# Patient Record
Sex: Female | Born: 1945 | Race: White | Hispanic: No | Smoking: Never smoker
Health system: Southern US, Community
[De-identification: ages and names within clinical notes are randomized; demographics above are authoritative.]

## PROBLEM LIST (undated history)

## (undated) DIAGNOSIS — L719 Rosacea, unspecified: Secondary | ICD-10-CM

## (undated) DIAGNOSIS — K219 Gastro-esophageal reflux disease without esophagitis: Secondary | ICD-10-CM

## (undated) DIAGNOSIS — Z86010 Personal history of colonic polyps: Secondary | ICD-10-CM

## (undated) HISTORY — DX: Rosacea, unspecified: L71.9

## (undated) HISTORY — PX: WISDOM TOOTH EXTRACTION: SHX21

## (undated) HISTORY — DX: Gastro-esophageal reflux disease without esophagitis: K21.9

## (undated) HISTORY — PX: RECTAL POLYPECTOMY: SHX2309

## (undated) HISTORY — PX: COLONOSCOPY: SHX174

## (undated) HISTORY — PX: HYSTEROSCOPY: SHX211

## (undated) HISTORY — DX: Personal history of colonic polyps: Z86.010

## (undated) HISTORY — PX: COLONOSCOPY W/ POLYPECTOMY: SHX1380

---

## 1999-02-10 ENCOUNTER — Other Ambulatory Visit: Admission: RE | Admit: 1999-02-10 | Discharge: 1999-02-10 | Payer: Self-pay | Admitting: Obstetrics and Gynecology

## 1999-03-25 ENCOUNTER — Encounter (INDEPENDENT_AMBULATORY_CARE_PROVIDER_SITE_OTHER): Payer: Self-pay

## 1999-03-25 ENCOUNTER — Other Ambulatory Visit: Admission: RE | Admit: 1999-03-25 | Discharge: 1999-03-25 | Payer: Self-pay | Admitting: Obstetrics and Gynecology

## 1999-06-08 ENCOUNTER — Encounter (INDEPENDENT_AMBULATORY_CARE_PROVIDER_SITE_OTHER): Payer: Self-pay

## 1999-06-08 ENCOUNTER — Other Ambulatory Visit: Admission: RE | Admit: 1999-06-08 | Discharge: 1999-06-08 | Payer: Self-pay | Admitting: Obstetrics and Gynecology

## 2000-03-23 ENCOUNTER — Other Ambulatory Visit: Admission: RE | Admit: 2000-03-23 | Discharge: 2000-03-23 | Payer: Self-pay | Admitting: Obstetrics and Gynecology

## 2000-09-12 ENCOUNTER — Other Ambulatory Visit: Admission: RE | Admit: 2000-09-12 | Discharge: 2000-09-12 | Payer: Self-pay | Admitting: Obstetrics and Gynecology

## 2000-10-23 ENCOUNTER — Other Ambulatory Visit: Admission: RE | Admit: 2000-10-23 | Discharge: 2000-10-23 | Payer: Self-pay | Admitting: Obstetrics and Gynecology

## 2000-10-23 ENCOUNTER — Encounter (INDEPENDENT_AMBULATORY_CARE_PROVIDER_SITE_OTHER): Payer: Self-pay | Admitting: Specialist

## 2001-04-30 ENCOUNTER — Other Ambulatory Visit: Admission: RE | Admit: 2001-04-30 | Discharge: 2001-04-30 | Payer: Self-pay | Admitting: Obstetrics and Gynecology

## 2001-07-05 ENCOUNTER — Encounter (INDEPENDENT_AMBULATORY_CARE_PROVIDER_SITE_OTHER): Payer: Self-pay

## 2001-07-05 ENCOUNTER — Ambulatory Visit (HOSPITAL_COMMUNITY): Admission: RE | Admit: 2001-07-05 | Discharge: 2001-07-05 | Payer: Self-pay | Admitting: Obstetrics and Gynecology

## 2002-05-05 ENCOUNTER — Other Ambulatory Visit: Admission: RE | Admit: 2002-05-05 | Discharge: 2002-05-05 | Payer: Self-pay | Admitting: Obstetrics and Gynecology

## 2003-01-21 ENCOUNTER — Encounter: Payer: Self-pay | Admitting: Obstetrics and Gynecology

## 2003-01-21 ENCOUNTER — Encounter: Admission: RE | Admit: 2003-01-21 | Discharge: 2003-01-21 | Payer: Self-pay | Admitting: Obstetrics and Gynecology

## 2003-05-27 ENCOUNTER — Other Ambulatory Visit: Admission: RE | Admit: 2003-05-27 | Discharge: 2003-05-27 | Payer: Self-pay | Admitting: Obstetrics and Gynecology

## 2003-07-21 ENCOUNTER — Encounter: Payer: Self-pay | Admitting: Obstetrics and Gynecology

## 2003-07-21 ENCOUNTER — Encounter: Admission: RE | Admit: 2003-07-21 | Discharge: 2003-07-21 | Payer: Self-pay | Admitting: Obstetrics and Gynecology

## 2004-06-22 ENCOUNTER — Other Ambulatory Visit: Admission: RE | Admit: 2004-06-22 | Discharge: 2004-06-22 | Payer: Self-pay | Admitting: Obstetrics and Gynecology

## 2004-07-28 ENCOUNTER — Encounter: Admission: RE | Admit: 2004-07-28 | Discharge: 2004-07-28 | Payer: Self-pay | Admitting: Obstetrics and Gynecology

## 2004-11-18 ENCOUNTER — Ambulatory Visit: Payer: Self-pay | Admitting: Gastroenterology

## 2004-12-15 ENCOUNTER — Ambulatory Visit: Payer: Self-pay | Admitting: Gastroenterology

## 2004-12-23 ENCOUNTER — Ambulatory Visit: Payer: Self-pay | Admitting: Gastroenterology

## 2005-01-02 ENCOUNTER — Ambulatory Visit: Payer: Self-pay | Admitting: Gastroenterology

## 2005-03-16 ENCOUNTER — Encounter (INDEPENDENT_AMBULATORY_CARE_PROVIDER_SITE_OTHER): Payer: Self-pay | Admitting: *Deleted

## 2005-03-16 ENCOUNTER — Observation Stay (HOSPITAL_COMMUNITY): Admission: RE | Admit: 2005-03-16 | Discharge: 2005-03-17 | Payer: Self-pay | Admitting: General Surgery

## 2005-07-10 ENCOUNTER — Other Ambulatory Visit: Admission: RE | Admit: 2005-07-10 | Discharge: 2005-07-10 | Payer: Self-pay | Admitting: Obstetrics and Gynecology

## 2005-08-01 ENCOUNTER — Encounter: Admission: RE | Admit: 2005-08-01 | Discharge: 2005-08-01 | Payer: Self-pay | Admitting: Obstetrics and Gynecology

## 2005-10-09 DIAGNOSIS — Z860100 Personal history of colon polyps, unspecified: Secondary | ICD-10-CM

## 2005-10-09 HISTORY — DX: Personal history of colon polyps, unspecified: Z86.0100

## 2005-10-09 HISTORY — PX: POLYPECTOMY: SHX149

## 2005-10-09 HISTORY — DX: Personal history of colonic polyps: Z86.010

## 2006-01-16 ENCOUNTER — Ambulatory Visit: Payer: Self-pay | Admitting: Gastroenterology

## 2006-02-07 ENCOUNTER — Ambulatory Visit (HOSPITAL_COMMUNITY): Admission: RE | Admit: 2006-02-07 | Discharge: 2006-02-07 | Payer: Self-pay | Admitting: Gastroenterology

## 2006-02-12 ENCOUNTER — Ambulatory Visit: Payer: Self-pay | Admitting: Gastroenterology

## 2006-08-02 ENCOUNTER — Encounter: Admission: RE | Admit: 2006-08-02 | Discharge: 2006-08-02 | Payer: Self-pay | Admitting: Obstetrics and Gynecology

## 2007-07-01 ENCOUNTER — Encounter: Admission: RE | Admit: 2007-07-01 | Discharge: 2007-07-01 | Payer: Self-pay | Admitting: Family Medicine

## 2007-08-05 ENCOUNTER — Encounter: Admission: RE | Admit: 2007-08-05 | Discharge: 2007-08-05 | Payer: Self-pay | Admitting: Obstetrics and Gynecology

## 2008-06-29 ENCOUNTER — Encounter: Payer: Self-pay | Admitting: Gastroenterology

## 2008-08-05 ENCOUNTER — Encounter: Admission: RE | Admit: 2008-08-05 | Discharge: 2008-08-05 | Payer: Self-pay | Admitting: Obstetrics and Gynecology

## 2009-05-18 ENCOUNTER — Encounter (INDEPENDENT_AMBULATORY_CARE_PROVIDER_SITE_OTHER): Payer: Self-pay | Admitting: *Deleted

## 2009-07-20 ENCOUNTER — Encounter (INDEPENDENT_AMBULATORY_CARE_PROVIDER_SITE_OTHER): Payer: Self-pay | Admitting: *Deleted

## 2009-08-24 ENCOUNTER — Encounter: Payer: Self-pay | Admitting: Internal Medicine

## 2009-08-24 ENCOUNTER — Encounter: Admission: RE | Admit: 2009-08-24 | Discharge: 2009-08-24 | Payer: Self-pay | Admitting: Obstetrics and Gynecology

## 2009-08-31 ENCOUNTER — Ambulatory Visit: Payer: Self-pay | Admitting: Internal Medicine

## 2009-08-31 DIAGNOSIS — Z860100 Personal history of colon polyps, unspecified: Secondary | ICD-10-CM | POA: Insufficient documentation

## 2009-08-31 DIAGNOSIS — M810 Age-related osteoporosis without current pathological fracture: Secondary | ICD-10-CM | POA: Insufficient documentation

## 2009-08-31 DIAGNOSIS — M8580 Other specified disorders of bone density and structure, unspecified site: Secondary | ICD-10-CM | POA: Insufficient documentation

## 2009-09-01 ENCOUNTER — Encounter: Payer: Self-pay | Admitting: Internal Medicine

## 2009-09-03 ENCOUNTER — Encounter: Admission: RE | Admit: 2009-09-03 | Discharge: 2009-09-03 | Payer: Self-pay | Admitting: Obstetrics and Gynecology

## 2009-10-28 ENCOUNTER — Emergency Department (HOSPITAL_BASED_OUTPATIENT_CLINIC_OR_DEPARTMENT_OTHER): Admission: EM | Admit: 2009-10-28 | Discharge: 2009-10-28 | Payer: Self-pay | Admitting: Emergency Medicine

## 2009-10-28 ENCOUNTER — Ambulatory Visit: Payer: Self-pay | Admitting: Radiology

## 2010-09-07 ENCOUNTER — Encounter: Admission: RE | Admit: 2010-09-07 | Discharge: 2010-09-07 | Payer: Self-pay | Admitting: Obstetrics and Gynecology

## 2010-12-09 ENCOUNTER — Encounter (INDEPENDENT_AMBULATORY_CARE_PROVIDER_SITE_OTHER): Payer: Self-pay | Admitting: *Deleted

## 2010-12-15 NOTE — Letter (Signed)
Summary: Pre Visit Letter Revised  [facility]  [address] [place], [place] [address]   Phone: [phone]  Fax: [phone]        12/09/2010 MRN: [id number] Nancy Carr [address] [place], [place]  [address]             Procedure Date:  01/27/2011 @ 8:30   Recall colon-Dr. Arlyce Dice   Welcome to the Gastroenterology Division at [facility].    You are scheduled to see a nurse for your pre-procedure visit on 01/18/2011 at 3:30 on the 3rd floor at [facility], [address]  We ask that you try to arrive at our office 15 minutes prior to your appointment time to allow for check-in.  Please take a minute to review the attached form.  If you answer "Yes" to one or more of the questions on the first page, we ask that you call the person listed at your earliest opportunity.  If you answer "No" to all of the questions, please complete the rest of the form and bring it to your appointment.    Your nurse visit will consist of discussing your medical and surgical history, your immediate family medical history, and your medications.   If you are unable to list all of your medications on the form, please bring the medication bottles to your appointment and we will list them.  We will need to be aware of both prescribed and over the counter drugs.  We will need to know exact dosage information as well.    Please be prepared to read and sign documents such as consent forms, a financial agreement, and acknowledgement forms.  If necessary, and with your consent, a friend or relative is welcome to sit-in on the nurse visit with you.  Please bring your insurance card so that we may make a copy of it.  If your insurance requires a referral to see a specialist, please bring your referral form from your primary care physician.  No co-pay is required for this nurse visit.     If you cannot keep your appointment, please call [phone] to cancel or reschedule prior to  your appointment date.  This allows [place] the opportunity to schedule an appointment for another patient in need of care.    Thank you for choosing  Gastroenterology for your medical needs.  We appreciate the opportunity to care for you.  Please visit [place] at our website  to learn more about our practice.  Sincerely, The Gastroenterology Division

## 2011-01-18 ENCOUNTER — Ambulatory Visit (AMBULATORY_SURGERY_CENTER): Payer: BLUE CROSS/BLUE SHIELD | Admitting: *Deleted

## 2011-01-18 VITALS — Ht 61.0 in | Wt 137.0 lb

## 2011-01-18 DIAGNOSIS — Z860100 Personal history of colon polyps, unspecified: Secondary | ICD-10-CM

## 2011-01-18 MED ORDER — PEG-KCL-NACL-NASULF-NA ASC-C 100 G PO SOLR: ORAL | Status: DC

## 2011-01-26 ENCOUNTER — Encounter: Payer: Self-pay | Admitting: Gastroenterology

## 2011-01-27 ENCOUNTER — Ambulatory Visit (AMBULATORY_SURGERY_CENTER): Payer: BLUE CROSS/BLUE SHIELD | Admitting: Gastroenterology

## 2011-01-27 ENCOUNTER — Encounter: Payer: Self-pay | Admitting: Gastroenterology

## 2011-01-27 VITALS — BP 113/76 | HR 71 | Temp 96.5°F | Resp 19 | Ht 61.0 in | Wt 135.0 lb

## 2011-01-27 DIAGNOSIS — Z1211 Encounter for screening for malignant neoplasm of colon: Secondary | ICD-10-CM

## 2011-01-27 DIAGNOSIS — Z86010 Personal history of colonic polyps: Secondary | ICD-10-CM

## 2011-01-27 DIAGNOSIS — K5730 Diverticulosis of large intestine without perforation or abscess without bleeding: Secondary | ICD-10-CM

## 2011-01-27 MED ORDER — SODIUM CHLORIDE 0.9 % IV SOLN: 500.0000 mL | INTRAVENOUS | Status: DC

## 2011-01-27 NOTE — Patient Instructions (Signed)
Discharged instructions given with verbal understanding. Handout on Diverticulosis. Resume previous medications.

## 2011-01-27 NOTE — Progress Notes (Signed)
PATIENT STATES SHE NEVER WANTS MOVIPREP AGAIN -GAGGING AND VOMITING WITH MOVI PREP PLEASE GIVE HER A DIFFERENT PREP- TOLERATED MIRALAX WELL

## 2011-01-30 ENCOUNTER — Telehealth: Payer: Self-pay | Admitting: *Deleted

## 2011-01-30 NOTE — Telephone Encounter (Signed)
o identifier on answering machine, no message left for the patient.

## 2011-02-24 NOTE — Op Note (Signed)
[facility]  Patient:    Nancy Carr, Nancy Carr Visit Number: [id number] MRN: [id number]          Service Type: DSU Location: [facility] Attending Physician:  Frederich Balding Dictated by:   Juluis Mire, M.D. Proc. Date: 07/05/01 Admit Date:  07/05/2001                             Operative Report  PREOPERATIVE DIAGNOSIS:       Postmenopausal bleeding.  POSTOPERATIVE DIAGNOSIS:      Postmenopausal bleeding.  OPERATIVE PROCEDURE:          Hysteroscopy with multiple endometrial biopsies using a resectoscope and endometrial curettings.  SURGEON:                      Juluis Mire, M.D.  ANESTHESIA:                   Sedation with paracervical block.  ESTIMATED BLOOD LOSS:         Minimal.  PACKS AND DRAINS:             None.  INTRAOPERATIVE BLOOD REPLACED:               None.  COMPLICATIONS:                None.  INDICATIONS:                  Dictated in the history and physical.  DESCRIPTION OF PROCEDURE:     The patient was taken to the OR and placed in the supine position.  After slight sedation, she was placed in the dorsal lithotomy position using the Allen stirrups.  The abdomen, perineum and vagina were prepped out with Betadine and draped as a sterile field.  Examination under anesthesia revealed the uterus to be upper limits of normal in size and adnexa unremarkable.  A speculum was placed in the vaginal vault.  The cervix was grasped with a single-tooth tenaculum.  Paracervical block was instituted using 1% Xylocaine.  The uterus sounded to approximately 7 cm.  The cervix was serially dilated to a #35 Pratt dilator.  The hysteroscope was then introduced. Complete visualization of the endometrium revealed atrophic endometrium.  There were no excrescences, polyps or solid areas.  There were certainly no worrisome areas areas.  We did multiple biopsies from the anterior, posterior and lateral walls.  These were sent for pathologic  review.  This was done using the resectoscope.  Next, endometrial curettings were obtained.  We had excellent hemostasis with no evidence of perforation.  The single-tooth tenaculum and speculum were then removed.  The patient was taken out of the dorsal lithotomy position and, once alert, was transferred to the recovery room in good condition.  Sponge, needle and instrument counts were reported as correct by the circulating nurse. Dictated by:   Juluis Mire, M.D. Attending Physician:  Frederich Balding DD:  07/05/01 TD:  07/05/01 Job: [id number]

## 2011-02-24 NOTE — Op Note (Signed)
NAMELYNNELLE, MESMER             ACCOUNT NO.:  [id number]   MEDICAL RECORD NO.:  [id number]          PATIENT TYPE:  AMB   LOCATION:  DAY                          FACILITY:  [facility]   PHYSICIAN:  Angelia Mould. Derrell Lolling, M.D.DATE OF BIRTH:  02/27/1946   DATE OF PROCEDURE:  03/16/2005  DATE OF DISCHARGE:                                 OPERATIVE REPORT   PREOPERATIVE DIAGNOSIS:  Villous adenoma of the rectum.   POSTOPERATIVE DIAGNOSIS:  Villous adenoma of the rectum.   OPERATION PERFORMED:  Transanal excision of rectal neoplasm.   SURGEON:  Angelia Mould. Derrell Lolling, M.D.   OPERATIVE INDICATIONS:  This is a 65 year old white female who is  asymptomatic and underwent screening colonoscopy. A sessile villous adenoma  was identified on the right side in the rectal vault just inside the dentate  line. Biopsy fragments showed villous adenoma and no evidence of malignancy.  On my exam, she has about a 3 cm polyp on the right lateral wall, which was  very flat, very soft and very subtle. She has been advised to have this  excised. She is brought to operating room electively following a bowel prep.   OPERATIVE TECHNIQUE:  Following the induction of general endotracheal  anesthesia, the patient was placed in the dorsolithotomy position. The  perianal area was prepped and draped in a sterile fashion. The perianal  tissues were infiltrated with 0.5% Marcaine with epinephrine. We then  dilated the rectal sphincter very slowly over the space about five minutes  until we could get four fingers in the anal canal. We then inserted a  variety of anoscopes and inspected the polyp. It was very sessile, very soft  and extended about 4 cm in axial length and about 3 cm transversely.   Stay sutures were placed inferiorly to pull the tumor down. Electrocautery  was used to begin to mark the limits of resection. I tried to get 3 or 4 mm  on all sides. I lifted the tumor up slowly from distal to proximal and as I  did  so I would place interrupted sutures of 2-0 Vicryl to close the rectal  wall. Great care was taken not to damage the external sphincters. With a  variety of anoscope placements, we marked and then cut through the wall of  the rectum anteriorly and posteriorly until we got to the apex of the polyp.  We then removed the polyp. We sent it for routine histology. We closed the  rectal wall full thickness with interrupted sutures of 2-0 Vicryl. We  probably placed about eight or nine sutures. After these were all placed, we  inspected the closure. It was hemostatic and looked complete. We palpated  the closure and we did not feel any gaps. I then placed a proctoscope up  about 20 cm well past the closure to make sure that there was no other  problem. I saw no hematoma. I saw no active bleeding. We irrigated this out  and then removed the proctoscope and placed a piece of Gelfoam up inside the  anal canal and distal rectum  for hemostasis. We observed  for about five minutes and there is no bleeding.  External gauze bandage was placed. The patient was taken to the recovery  room in stable condition. Estimated blood loss was about 20-30 mL.  Complications none. The sponge and counts were correct.       HMI/MEDQ  D:  03/16/2005  T:  03/16/2005  Job:  [id number]   cc:   Barbette Hair. Arlyce Dice, M.D. [facility]   Billie Lade, M.D.  [address] - [facility]  [place]  [place] [address]  Fax: [phone]   Juluis Mire, M.D.  [address] [place]  [place] [address]  Fax: [phone]

## 2011-02-24 NOTE — H&P (Signed)
[facility]  Patient:    Nancy Carr, HERD Visit Number: [id number] MRN: [id number]          Service Type: Attending:  Juluis Mire, M.D. Dictated by:   Juluis Mire, M.D. Adm. Date:  07/05/01                           History and Physical  HISTORY OF PRESENT ILLNESS:   The patient is a 65 year old gravida 2, para 2 postmenopausal female who presents for hysteroscopy with resectoscope for evaluation of postmenopausal bleeding.  The patient had previously been on Prempro for management of menopausal symptomatology.  Because of recent concerns, this was discontinued.  The patient had continued to have some bleeding since discontinuing Prempro and, because of this, we are going to proceed with hysteroscopy along with D&C for further evaluation.  It is of note that the patient has had a previous ultrasound as well as endometrial sampling done in January 2002.  This was basically negative, with the finding of weakly proliferative endometrium. Multiple fibroids were noted.  ALLERGIES:                    No known drug allergies.  MEDICATIONS:                  Fosamax.  Calcium.  PAST MEDICAL HISTORY:         Usual childhood diseases.  No significant sequelae.  PAST SURGICAL HISTORY:        None.  PAST OBSTETRIC HISTORY:       Two spontaneous vaginal deliveries.  FAMILY HISTORY:               Noncontributory.  SOCIAL HISTORY:               No tobacco or alcohol use.  REVIEW OF SYSTEMS:            Noncontributory.  PHYSICAL EXAMINATION:  VITAL SIGNS:                  Afebrile with stable vital signs.  HEENT:                        Normocephalic.  Pupils equal, round and reactive to light and accommodation.  Extraocular movements intact.  Sclerae and conjunctivae were clear.  Oropharynx clear.  NECK:                         Without thyromegaly.  BREASTS:                      No discrete masses.  LUNGS:                         Clear.  CARDIAC:                      Regular rate and rhythm without murmurs or gallops.  ABDOMEN:                      Benign.  PELVIC:                       Normal external genitalia.  The vaginal mucosa is clear.  The cervix is unremarkable.  The uterus is upper limits of normal size and  slightly irregular consistent with known uterine fibroids.  Adnexa unremarkable.  Rectovaginal clear.  EXTREMITIES:                  Trace edema.  NEUROLOGIC:                   Grossly within normal limits.  IMPRESSION:                   1. Postmenopausal bleeding.                               2. Uterine fibroids.  PLAN:                         The patient will undergo hysteroscopy along with D&C for evaluation.  The risks have been discussed including anesthetic concerns.  The risk of infection.  The rare risk of hemorrhage that could require transfusion or hysterectomy.  The risk of uterine perforation that could lead to injury to adjacent organs requiring laparoscopy and possible exploratory laparotomy for management.  The risk of deep venous thrombosis and pulmonary embolus.  The patient expressed an understanding of the indications and risks. Dictated by:   Juluis Mire, M.D. Attending:  Juluis Mire, M.D. DD:  07/05/01 TD:  07/05/01 Job: [id number]

## 2011-03-02 ENCOUNTER — Ambulatory Visit (INDEPENDENT_AMBULATORY_CARE_PROVIDER_SITE_OTHER): Payer: BC Managed Care – PPO | Admitting: Internal Medicine

## 2011-03-02 ENCOUNTER — Encounter: Payer: Self-pay | Admitting: Internal Medicine

## 2011-03-02 DIAGNOSIS — Z0000 Encounter for general adult medical examination without abnormal findings: Secondary | ICD-10-CM

## 2011-03-02 DIAGNOSIS — L719 Rosacea, unspecified: Secondary | ICD-10-CM

## 2011-03-02 DIAGNOSIS — Z86010 Personal history of colonic polyps: Secondary | ICD-10-CM

## 2011-03-02 DIAGNOSIS — M810 Age-related osteoporosis without current pathological fracture: Secondary | ICD-10-CM

## 2011-03-02 MED ORDER — METRONIDAZOLE 0.75 % EX CREA: 1.0000 "application " | TOPICAL_CREAM | Freq: Every day | CUTANEOUS | Status: DC

## 2011-03-02 NOTE — Assessment & Plan Note (Signed)
BMD 10/2010 by Dr Sharion Dove; improvement with Bisphosphonate

## 2011-03-02 NOTE — Patient Instructions (Addendum)
Preventive Health Care: Eat a low-fat diet with lots of fruits and vegetables, up to 7-9 servings per day. Avoid obesity; your goal = waist less than 35 inches.Consume less than 30 grams of sugar per day from foods & drinks with High Fructose Corn Syrup as #2,3 or #4 on label. Recommended lifestyle interventions for Osteoporosis include calcium 600 mg twice a day  & vitamin D3 supplementation to keep vit D  level @ least 40-60. The usual vitamin D3 dose is 1000 IU daily; but individual dose is determined by annual vitamin D level monitor. Also weight bearing exercise such as  walking 30-45 minutes 3-4  X per week is recommended. [employer]

## 2011-03-02 NOTE — Assessment & Plan Note (Signed)
Negative 2012, Dr Arlyce Dice; due 2017

## 2011-03-02 NOTE — Progress Notes (Signed)
Subjective:    Patient ID: Nancy Carr, female    DOB: Mar 26, 1946, 65 y.o.   MRN: [id number]  HPI Nancy Carr is here for a physical ; she is asymptomatic.    Review of Systems Patient reports no vision/ hearing  Changes (PMH of L TM rupture), adenopathy,fever, weight change,  persistant / recurrent hoarseness , swallowing issues, chest pain,palpitations,edema,persistant /recurrent cough, hemoptysis, dyspnea( rest/ exertional/paroxysmal nocturnal), gastrointestinal bleeding(melena, rectal bleeding), abdominal pain, significant heartburn,  bowel changes,GU symptoms(dysuria, hematuria,pyuria, incontinence), Gyn symptoms(abnormal  bleeding , pain),  syncope, focal weakness, memory loss,numbness & tingling, skin/hair /nail changes,abnormal bruising or bleeding, anxiety,or depression.      Objective:   Physical Exam Gen.: Healthy and well-nourished in appearance. Alert, appropriate and cooperative throughout exam. Head: Normocephalic without obvious abnormalities. Eyes: No corneal or conjunctival inflammation noted. Pupils equal round reactive to light and accommodation. Fundal exam is benign without hemorrhages, exudate, papilledema. Extraocular motion intact. Vision grossly normal. Ears: External  ear exam reveals no significant lesions or deformities. Canals clear .TMs normal. Hearing is grossly normal bilaterally. Nose: External nasal exam reveals no deformity or inflammation. Nasal mucosa are pink and moist. No lesions or exudates noted.  Mouth: Oral mucosa and oropharynx reveal no lesions or exudates. Teeth in good repair. Neck: No deformities, masses, or tenderness noted. Range of motion & . Thyroid  normal. Lungs: Normal respiratory effort; chest expands symmetrically. Lungs are clear to auscultation without rales, wheezes, or increased work of breathing. Heart: Normal rate and rhythm. Normal S1 and S2. No gallop, click, or rub. No  murmur. Abdomen: Bowel sounds normal; abdomen soft and  nontender. No masses, organomegaly or hernias noted. Genitalia:  Nancy Carr 09/2010.                                                                                                                       Musculoskeletal/extremities:Mild lordosis noted of  the thoracic  spine. No clubbing, cyanosis, edema, or deformity noted. Range of motion  normal .Tone & strength  normal.Joints normal. Nail health  good. Vascular: Carotid, radial artery, dorsalis pedis and dorsalis posterior tibial pulses are full and equal. No bruits present. Neurologic: Alert and oriented x3. Deep tendon reflexes symmetrical and normal.                                                                     Skin: Intact without suspicious lesions or rashes. Lymph: No cervical, axillary, or inguinal lymphadenopathy present. Psych: Mood and affect are normal. Normally interactive  Assessment & Plan:

## 2011-03-03 ENCOUNTER — Encounter: Payer: Self-pay | Admitting: Internal Medicine

## 2011-03-07 ENCOUNTER — Encounter: Payer: Self-pay | Admitting: Internal Medicine

## 2011-08-08 ENCOUNTER — Other Ambulatory Visit: Payer: Self-pay | Admitting: Obstetrics and Gynecology

## 2011-08-08 DIAGNOSIS — Z1231 Encounter for screening mammogram for malignant neoplasm of breast: Secondary | ICD-10-CM

## 2011-09-11 ENCOUNTER — Ambulatory Visit
Admission: RE | Admit: 2011-09-11 | Discharge: 2011-09-11 | Disposition: A | Discharge status: Home / Self Care | Payer: Medicare Other | Source: Ambulatory Visit | Attending: Obstetrics and Gynecology | Admitting: Obstetrics and Gynecology

## 2011-09-11 DIAGNOSIS — Z1231 Encounter for screening mammogram for malignant neoplasm of breast: Secondary | ICD-10-CM

## 2011-10-27 DIAGNOSIS — N72 Inflammatory disease of cervix uteri: Secondary | ICD-10-CM | POA: Diagnosis not present

## 2011-10-27 DIAGNOSIS — M810 Age-related osteoporosis without current pathological fracture: Secondary | ICD-10-CM | POA: Diagnosis not present

## 2011-10-27 DIAGNOSIS — Z124 Encounter for screening for malignant neoplasm of cervix: Secondary | ICD-10-CM | POA: Diagnosis not present

## 2011-11-10 ENCOUNTER — Ambulatory Visit: Payer: Medicare Other | Admitting: Gastroenterology

## 2011-11-21 ENCOUNTER — Encounter: Payer: Self-pay | Admitting: Gastroenterology

## 2011-11-21 ENCOUNTER — Ambulatory Visit (INDEPENDENT_AMBULATORY_CARE_PROVIDER_SITE_OTHER): Payer: Medicare Other | Admitting: Gastroenterology

## 2011-11-21 VITALS — BP 126/88 | HR 72 | Ht 60.0 in | Wt 147.0 lb

## 2011-11-21 DIAGNOSIS — K219 Gastro-esophageal reflux disease without esophagitis: Secondary | ICD-10-CM

## 2011-11-21 DIAGNOSIS — R079 Chest pain, unspecified: Secondary | ICD-10-CM | POA: Insufficient documentation

## 2011-11-21 DIAGNOSIS — Z86010 Personal history of colonic polyps: Secondary | ICD-10-CM | POA: Diagnosis not present

## 2011-11-21 MED ORDER — OMEPRAZOLE-SODIUM BICARBONATE 40-1100 MG PO CAPS: 1.0000 | ORAL_CAPSULE | Freq: Every day | ORAL | Status: AC

## 2011-11-21 NOTE — Patient Instructions (Signed)
Your Endoscopy is scheduled on 11/24/2011 at 3:30pm   Upper GI Endoscopy Upper GI endoscopy means using a flexible scope to look at the esophagus, stomach, and upper small bowel. This is done to make a diagnosis in people with heartburn, abdominal pain, or abnormal bleeding. Sometimes an endoscope is needed to remove foreign bodies or food that become stuck in the esophagus; it can also be used to take biopsy samples. For the best results, do not eat or drink for 8 hours before having your upper endoscopy.  To perform the endoscopy, you will probably be sedated and your throat will be numbed with a special spray. The endoscope is then slowly passed down your throat (this will not interfere with your breathing). An endoscopy exam takes 15 to 30 minutes to complete and there is no real pain. Patients rarely remember much about the procedure. The results of the test may take several days if a biopsy or other test is taken.  You may have a sore throat after an endoscopy exam. Serious complications are very rare. Stick to liquids and soft foods until your pain is better. Do not drive a car or operate any dangerous equipment for at least 24 hours after being sedated. SEEK IMMEDIATE MEDICAL CARE IF:   You have severe throat pain.   You have shortness of breath.   You have bleeding problems.   You have a fever.   You have difficulty recovering from your sedation.  Document Released: 11/02/2004 Document Revised: 06/07/2011 Document Reviewed: 09/27/2008 [facility] Patient Information 2012 [place], [place].

## 2011-11-21 NOTE — Assessment & Plan Note (Signed)
Followup colonoscopy 2019 

## 2011-11-21 NOTE — Progress Notes (Signed)
History of Present Illness:  Nancy Carr is a pleasant 66 year old white female referred at the request of Dr. Alwyn Ren for evaluation of chest tightness. For several months she's been complaining of a sensation of chest tightness and fullness after retiring. She denies pyrosis, per se, or dysphagia. She does experience water brash and occasional regurgitation of gastric contents.    Past Medical History  Diagnosis Date  . Osteoporosis   . GERD (gastroesophageal reflux disease)     mild  . Rosacea   . Hx of colonic polyps 2007    villous adenoma    Past Surgical History  Procedure Date  . Colonoscopy   . Colonoscopy w/ polypectomy   . Polypectomy 2007    done by surgeon, Dr Derrell Lolling  . Wisdom tooth extraction   . Hysteroscopy    family history includes Asthma in her sister; Coronary artery disease in her father, maternal grandfather, and paternal grandfather; Dementia in her maternal uncle and mother; Heart attack in her maternal grandmother; Hypertension in her sister; Irritable bowel syndrome in her mother; Melanoma in her sister; Stroke in her paternal grandmother; and Uterine cancer in her sister. No current outpatient prescriptions on file.   Current Facility-Administered Medications  Medication Dose Route Frequency Provider Last Rate Last Dose  . DISCONTD: 0.9 %  sodium chloride infusion  500 mL Intravenous Continuous Louis Meckel, MD       Allergies as of 11/21/2011  . (No Known Allergies)    reports that she has never smoked. She has never used smokeless tobacco. She reports that she drinks about 4.2 ounces of alcohol per week. She reports that she does not use illicit drugs.     Review of Systems: Pertinent positive and negative review of systems were noted in the above HPI section. All other review of systems were otherwise negative.  Vital signs were reviewed in today's medical record Physical Exam: General: Well developed , well nourished, no acute distress Head:  Normocephalic and atraumatic Eyes:  sclerae anicteric, EOMI Ears: Normal auditory acuity Mouth: No deformity or lesions Neck: Supple, no masses or thyromegaly Lungs: Clear throughout to auscultation Heart: Regular rate and rhythm; no murmurs, rubs or bruits Abdomen: Soft, non tender and non distended. No masses, hepatosplenomegaly or hernias noted. Normal Bowel sounds Rectal:deferred Musculoskeletal: Symmetrical with no gross deformities  Skin: No lesions on visible extremities Pulses:  Normal pulses noted Extremities: No clubbing, cyanosis, edema or deformities noted Neurological: Alert oriented x 4, grossly nonfocal Cervical Nodes:  No significant cervical adenopathy Inguinal Nodes: No significant inguinal adenopathy Psychological:  Alert and cooperative. Normal mood and affect

## 2011-11-21 NOTE — Assessment & Plan Note (Addendum)
Chest discomfort is probably related to nocturnal GERD. Cardiac is much less likely.  Medications #1 trial of Zegerid 40 mg each bedtime #2 endoscopy

## 2011-11-24 ENCOUNTER — Encounter: Payer: Medicare Other | Admitting: Gastroenterology

## 2011-12-14 ENCOUNTER — Encounter: Payer: Medicare Other | Admitting: Gastroenterology

## 2011-12-18 ENCOUNTER — Encounter: Payer: Self-pay | Admitting: Gastroenterology

## 2011-12-18 ENCOUNTER — Ambulatory Visit (AMBULATORY_SURGERY_CENTER): Payer: Medicare Other | Admitting: Gastroenterology

## 2011-12-18 VITALS — BP 118/72 | HR 69 | Temp 96.5°F | Resp 16 | Ht 60.0 in | Wt 147.0 lb

## 2011-12-18 DIAGNOSIS — D131 Benign neoplasm of stomach: Secondary | ICD-10-CM | POA: Diagnosis not present

## 2011-12-18 DIAGNOSIS — K2270 Barrett's esophagus without dysplasia: Secondary | ICD-10-CM | POA: Diagnosis not present

## 2011-12-18 DIAGNOSIS — K219 Gastro-esophageal reflux disease without esophagitis: Secondary | ICD-10-CM | POA: Diagnosis not present

## 2011-12-18 DIAGNOSIS — Z86010 Personal history of colonic polyps: Secondary | ICD-10-CM | POA: Diagnosis not present

## 2011-12-18 MED ORDER — SODIUM CHLORIDE 0.9 % IV SOLN: 500.0000 mL | INTRAVENOUS | Status: DC

## 2011-12-18 MED ORDER — OMEPRAZOLE-SODIUM BICARBONATE 40-1100 MG PO CAPS: 1.0000 | ORAL_CAPSULE | Freq: Two times a day (BID) | ORAL | Status: AC

## 2011-12-18 NOTE — Patient Instructions (Signed)
YOU HAD AN ENDOSCOPIC PROCEDURE TODAY AT THE [facility]: Refer to the procedure report that was given to you for any specific questions about what was found during the examination.  If the procedure report does not answer your questions, please call your gastroenterologist to clarify.  If you requested that your care partner not be given the details of your procedure findings, then the procedure report has been included in a sealed envelope for you to review at your convenience later.  YOU SHOULD EXPECT: Some feelings of bloating in the abdomen. Passage of more gas than usual.  Walking can help get rid of the air that was put into your GI tract during the procedure and reduce the bloating. If you had a lower endoscopy (such as a colonoscopy or flexible sigmoidoscopy) you may notice spotting of blood in your stool or on the toilet paper. If you underwent a bowel prep for your procedure, then you may not have a normal bowel movement for a few days.  DIET: Your first meal following the procedure should be a light meal and then it is ok to progress to your normal diet.  A half-sandwich or bowl of soup is an example of a good first meal.  Heavy or fried foods are harder to digest and may make you feel nauseous or bloated.  Likewise meals heavy in dairy and vegetables can cause extra gas to form and this can also increase the bloating.  Drink plenty of fluids but you should avoid alcoholic beverages for 24 hours.  ACTIVITY: Your care partner should take you home directly after the procedure.  You should plan to take it easy, moving slowly for the rest of the day.  You can resume normal activity the day after the procedure however you should NOT DRIVE or use heavy machinery for 24 hours (because of the sedation medicines used during the test).    SYMPTOMS TO REPORT IMMEDIATELY: A gastroenterologist can be reached at any hour.  During normal business hours, 8:30 AM to 5:00 PM Monday through Friday,  call [phone].  After hours and on weekends, please call the GI answering service at [phone] who will take a message and have the physician on call contact you.  Following upper endoscopy (EGD)  Vomiting of blood or coffee ground material  New chest pain or pain under the shoulder blades  Painful or persistently difficult swallowing  New shortness of breath  Fever of 100F or higher  Black, tarry-looking stools  FOLLOW UP: If any biopsies were taken you will be contacted by phone or by letter within the next 1-3 weeks.  Call your gastroenterologist if you have not heard about the biopsies in 3 weeks.  Our staff will call the home number listed on your records the next business day following your procedure to check on you and address any questions or concerns that you may have at that time regarding the information given to you following your procedure. This is a courtesy call and so if there is no answer at the home number and we have not heard from you through the emergency physician on call, we will assume that you have returned to your regular daily activities without incident.  SIGNATURES/CONFIDENTIALITY: You and/or your care partner have signed paperwork which will be entered into your electronic medical record.  These signatures attest to the fact that that the information above on your After Visit Summary has been reviewed and is understood.  Full responsibility of   the confidentiality of this discharge information lies with you and/or your care-partner. 

## 2011-12-18 NOTE — Op Note (Signed)
[facility] [address] [place], [place]  [address]  E1 NDOSCOPY PROCEDURE REPORT  PATIENT:  Nancy Carr, Nancy Carr  MR#:  [id number] BIRTHDATE:  [birth year], 65 yrs. old  GENDER:  female  ENDOSCOPIST:  Barbette Hair. Arlyce Dice, MD Referred by:  PROCEDURE DATE:  12/18/2011 PROCEDURE:  EGD with biopsy, 72536 ASA CLASS:  Class II INDICATIONS:  GERD  MEDICATIONS:   MAC sedation, administered by CRNA propofol 100mg IV, glycopyrrolate (Robinal) 0.2 mg IV, 0.6cc simethancone 0.6 cc PO TOPICAL ANESTHETIC:  DESCRIPTION OF PROCEDURE:   After the risks and benefits of the procedure were explained, informed consent was obtained.  The LB [id number] endoscope was introduced through the mouth and advanced to the third portion of the duodenum.  The instrument was slowly withdrawn as the mucosa was fully examined. <<PROCEDUREIMAGES>>  irregular Z-line at the gastroesophageal junction (see image1). Biopsies were taken to r/o Barrett's esophagus  There were multiple polyps identified. in the fundus (see image4). At least 3 sessile fundic appearing polyps  A hiatal hernia was found. 2cm sliding hiatal hernia  Otherwise the examination was normal (see image2 and image3).    Retroflexed views revealed no abnormalities.    The scope was then withdrawn from the patient and the procedure completed.  COMPLICATIONS:  None  ENDOSCOPIC IMPRESSION: 1) Irregular Z-line at the gastroesophageal junction 2) Polyps, multiple in the fundus 3) Hiatal hernia 4) Otherwise normal examination RECOMMENDATIONS: 1) continue current medications 2) Await biopsy results 3) Call office next 2-3 days to schedule an office appointment for 1 month  ______________________________ Barbette Hair. Arlyce Dice, MD  CC:  Pecola Lawless, MD  n. Rosalie Doctor:   Barbette Hair. Adi Seales at 12/18/2011 08:57 AM  Chriss Driver, [phone]

## 2011-12-18 NOTE — Progress Notes (Signed)
Patient did not experience any of the following events: a burn prior to discharge; a fall within the facility; wrong site/side/patient/procedure/implant event; or a hospital transfer or hospital admission upon discharge from the facility. (G8907) Patient did not have preoperative order for IV antibiotic SSI prophylaxis. (G8918)  

## 2011-12-19 ENCOUNTER — Telehealth: Payer: Self-pay | Admitting: *Deleted

## 2011-12-19 NOTE — Telephone Encounter (Signed)
  Follow up Call-  Call back number 12/18/2011 01/27/2011  Post procedure Call Back phone  # [phone]   Permission to leave phone message Yes -    Telephone number provided has been disconnected. Unable to leave message

## 2011-12-26 ENCOUNTER — Encounter: Payer: Self-pay | Admitting: Gastroenterology

## 2012-01-10 ENCOUNTER — Ambulatory Visit (INDEPENDENT_AMBULATORY_CARE_PROVIDER_SITE_OTHER): Payer: Medicare Other | Admitting: Gastroenterology

## 2012-01-10 ENCOUNTER — Encounter: Payer: Self-pay | Admitting: Gastroenterology

## 2012-01-10 VITALS — BP 110/76 | HR 88 | Ht 60.0 in | Wt 150.2 lb

## 2012-01-10 DIAGNOSIS — R079 Chest pain, unspecified: Secondary | ICD-10-CM

## 2012-01-10 DIAGNOSIS — K2270 Barrett's esophagus without dysplasia: Secondary | ICD-10-CM | POA: Diagnosis not present

## 2012-01-10 DIAGNOSIS — Z8719 Personal history of other diseases of the digestive system: Secondary | ICD-10-CM | POA: Insufficient documentation

## 2012-01-10 NOTE — Assessment & Plan Note (Signed)
Plan followup endoscopy in one year. Patient will continue Zegerid.

## 2012-01-10 NOTE — Assessment & Plan Note (Signed)
Excellent response to his every each bedtime. We'll continue with the same.

## 2012-01-10 NOTE — Patient Instructions (Signed)

## 2012-01-10 NOTE — Progress Notes (Signed)
History of Present Illness:  Mrs. Wilbon has returned following upper endoscopy for nocturnal GERD. On Zegerid symptoms have entirely resolved. Endoscopy demonstrated changes consistent with Barrett's esophagus. This was confirmed by biopsy.    Review of Systems: Pertinent positive and negative review of systems were noted in the above HPI section. All other review of systems were otherwise negative.    Current Medications, Allergies, Past Medical History, Past Surgical History, Family History and Social History were reviewed in [employer] electronic medical record  Vital signs were reviewed in today's medical record. Physical Exam: General: Well developed , well nourished, no acute distress

## 2012-08-09 ENCOUNTER — Other Ambulatory Visit: Payer: Self-pay | Admitting: Obstetrics and Gynecology

## 2012-08-09 DIAGNOSIS — Z1231 Encounter for screening mammogram for malignant neoplasm of breast: Secondary | ICD-10-CM

## 2012-09-13 ENCOUNTER — Ambulatory Visit
Admission: RE | Admit: 2012-09-13 | Discharge: 2012-09-13 | Disposition: A | Discharge status: Home / Self Care | Payer: Medicare Other | Source: Ambulatory Visit | Attending: Obstetrics and Gynecology | Admitting: Obstetrics and Gynecology

## 2012-09-13 DIAGNOSIS — Z1231 Encounter for screening mammogram for malignant neoplasm of breast: Secondary | ICD-10-CM | POA: Diagnosis not present

## 2012-10-30 DIAGNOSIS — Z124 Encounter for screening for malignant neoplasm of cervix: Secondary | ICD-10-CM | POA: Diagnosis not present

## 2012-10-30 DIAGNOSIS — M810 Age-related osteoporosis without current pathological fracture: Secondary | ICD-10-CM | POA: Diagnosis not present

## 2012-10-30 DIAGNOSIS — Z13820 Encounter for screening for osteoporosis: Secondary | ICD-10-CM | POA: Diagnosis not present

## 2012-10-30 DIAGNOSIS — Z9189 Other specified personal risk factors, not elsewhere classified: Secondary | ICD-10-CM | POA: Diagnosis not present

## 2012-10-31 ENCOUNTER — Other Ambulatory Visit: Payer: Self-pay | Admitting: Obstetrics and Gynecology

## 2012-10-31 DIAGNOSIS — N6459 Other signs and symptoms in breast: Secondary | ICD-10-CM

## 2012-11-07 ENCOUNTER — Ambulatory Visit
Admission: RE | Admit: 2012-11-07 | Discharge: 2012-11-07 | Disposition: A | Discharge status: Home / Self Care | Payer: Medicare Other | Source: Ambulatory Visit | Attending: Obstetrics and Gynecology | Admitting: Obstetrics and Gynecology

## 2012-11-07 DIAGNOSIS — N6459 Other signs and symptoms in breast: Secondary | ICD-10-CM

## 2012-11-13 DIAGNOSIS — L719 Rosacea, unspecified: Secondary | ICD-10-CM | POA: Diagnosis not present

## 2012-11-13 DIAGNOSIS — L821 Other seborrheic keratosis: Secondary | ICD-10-CM | POA: Diagnosis not present

## 2012-11-13 DIAGNOSIS — L909 Atrophic disorder of skin, unspecified: Secondary | ICD-10-CM | POA: Diagnosis not present

## 2012-11-13 DIAGNOSIS — L919 Hypertrophic disorder of the skin, unspecified: Secondary | ICD-10-CM | POA: Diagnosis not present

## 2012-11-19 ENCOUNTER — Other Ambulatory Visit: Payer: Self-pay | Admitting: Obstetrics and Gynecology

## 2012-11-19 DIAGNOSIS — N6453 Retraction of nipple: Secondary | ICD-10-CM

## 2012-11-27 ENCOUNTER — Ambulatory Visit
Admission: RE | Admit: 2012-11-27 | Discharge: 2012-11-27 | Disposition: A | Discharge status: Home / Self Care | Payer: Medicare Other | Source: Ambulatory Visit | Attending: Obstetrics and Gynecology | Admitting: Obstetrics and Gynecology

## 2012-11-27 DIAGNOSIS — N6049 Mammary duct ectasia of unspecified breast: Secondary | ICD-10-CM | POA: Diagnosis not present

## 2012-11-27 DIAGNOSIS — N6019 Diffuse cystic mastopathy of unspecified breast: Secondary | ICD-10-CM | POA: Diagnosis not present

## 2012-11-27 DIAGNOSIS — N6459 Other signs and symptoms in breast: Secondary | ICD-10-CM | POA: Diagnosis not present

## 2012-11-27 DIAGNOSIS — N6453 Retraction of nipple: Secondary | ICD-10-CM

## 2012-11-27 MED ORDER — GADOBENATE DIMEGLUMINE 529 MG/ML IV SOLN
14.0000 mL | Freq: Once | INTRAVENOUS | Status: AC | PRN
  Administered 2012-11-27: 14 mL via INTRAVENOUS

## 2012-12-04 ENCOUNTER — Encounter: Payer: Self-pay | Admitting: Gastroenterology

## 2013-03-31 DIAGNOSIS — IMO0002 Reserved for concepts with insufficient information to code with codable children: Secondary | ICD-10-CM | POA: Diagnosis not present

## 2013-08-07 ENCOUNTER — Encounter: Payer: Self-pay | Admitting: Gastroenterology

## 2013-08-27 ENCOUNTER — Other Ambulatory Visit: Payer: Self-pay

## 2013-08-27 DIAGNOSIS — Z1231 Encounter for screening mammogram for malignant neoplasm of breast: Secondary | ICD-10-CM

## 2013-09-24 ENCOUNTER — Ambulatory Visit (AMBULATORY_SURGERY_CENTER): Payer: Self-pay | Admitting: *Deleted

## 2013-09-24 VITALS — Ht 60.0 in | Wt 158.0 lb

## 2013-09-24 DIAGNOSIS — K2270 Barrett's esophagus without dysplasia: Secondary | ICD-10-CM

## 2013-09-24 NOTE — Progress Notes (Signed)
Patient denies any allergies to eggs or soy. 

## 2013-10-06 ENCOUNTER — Ambulatory Visit
Admission: RE | Admit: 2013-10-06 | Discharge: 2013-10-06 | Disposition: A | Discharge status: Home / Self Care | Payer: Medicare Other | Source: Ambulatory Visit

## 2013-10-06 DIAGNOSIS — J209 Acute bronchitis, unspecified: Secondary | ICD-10-CM | POA: Diagnosis not present

## 2013-10-06 DIAGNOSIS — Z1231 Encounter for screening mammogram for malignant neoplasm of breast: Secondary | ICD-10-CM

## 2013-10-06 DIAGNOSIS — J00 Acute nasopharyngitis [common cold]: Secondary | ICD-10-CM | POA: Diagnosis not present

## 2013-10-16 ENCOUNTER — Ambulatory Visit (AMBULATORY_SURGERY_CENTER): Payer: Medicare Other | Admitting: Gastroenterology

## 2013-10-16 ENCOUNTER — Encounter: Payer: Self-pay | Admitting: Gastroenterology

## 2013-10-16 VITALS — BP 116/72 | HR 58 | Temp 97.3°F | Resp 15 | Ht 60.0 in | Wt 158.0 lb

## 2013-10-16 DIAGNOSIS — K2270 Barrett's esophagus without dysplasia: Secondary | ICD-10-CM

## 2013-10-16 DIAGNOSIS — M899 Disorder of bone, unspecified: Secondary | ICD-10-CM | POA: Diagnosis not present

## 2013-10-16 DIAGNOSIS — D131 Benign neoplasm of stomach: Secondary | ICD-10-CM

## 2013-10-16 DIAGNOSIS — Z86010 Personal history of colonic polyps: Secondary | ICD-10-CM | POA: Diagnosis not present

## 2013-10-16 DIAGNOSIS — M949 Disorder of cartilage, unspecified: Secondary | ICD-10-CM | POA: Diagnosis not present

## 2013-10-16 MED ORDER — SODIUM CHLORIDE 0.9 % IV SOLN: 500.0000 mL | INTRAVENOUS | Status: DC

## 2013-10-16 NOTE — Progress Notes (Signed)
Called to room to assist during endoscopic procedure.  Patient ID and intended procedure confirmed with present staff. Received instructions for my participation in the procedure from the performing physician.  

## 2013-10-16 NOTE — Op Note (Addendum)
[place]  [address] [place], [address]   ENDOSCOPY PROCEDURE REPORT  PATIENT: Nancy Carr, Nancy Carr  MR#: [id number] BIRTHDATE: 03-27-1946 , 67  yrs. old GENDER: Female ENDOSCOPIST: Inda Castle, MD REFERRED BY: PROCEDURE DATE:  10/16/2013 PROCEDURE:  EGD w/ biopsy ASA CLASS:     Class II INDICATIONS:  Surveillance. history of Barrett's esophagus MEDICATIONS: MAC sedation, administered by CRNA, propofol (Diprivan) 150mg  IV, and Simethicone 0.6cc PO TOPICAL ANESTHETIC:  DESCRIPTION OF PROCEDURE: After the risks benefits and alternatives of the procedure were thoroughly explained, informed consent was obtained.  The LB [id number] endoscope was introduced through the mouth and advanced to the third portion of the duodenum. Without limitations.  The instrument was slowly withdrawn as the mucosa was fully examined.      A 5 cm sliding hiatal hernia was present.  The GE junction was irregular.  Proximal extent of gastric appearing epithelium was approximately 1 cm.  Biopsies were taken from a known area of Barrett's esophagus. A nonobstructing early esophageal stricture was present. Multiple fundic gland polyps measuring 2-3 mm were seen in the fundus and cardia.   The remainder of the upper endoscopy exam was otherwise normal.  Retroflexed views revealed no abnormalities. The scope was then withdrawn from the patient and the procedure completed.  COMPLICATIONS: There were no complications. ENDOSCOPIC IMPRESSION: 1.   Barrett's esophagus 2.  hiatal hernia 3.  fundic gland polyps  RECOMMENDATIONS: Await biopsy results  REPEAT EXAM:  eSigned:  Inda Castle, MD 10/16/2013 10:13 AM Revised: 10/16/2013 10:13 AM  GX:[id number] Chanda Busing, MD  PATIENT NAME:  Zaynab, Chipman MR#: [id number]

## 2013-10-16 NOTE — Patient Instructions (Signed)
YOU HAD AN ENDOSCOPIC PROCEDURE TODAY AT THE [facility]: Refer to the procedure report that was given to you for any specific questions about what was found during the examination.  If the procedure report does not answer your questions, please call your gastroenterologist to clarify.  If you requested that your care partner not be given the details of your procedure findings, then the procedure report has been included in a sealed envelope for you to review at your convenience later.  YOU SHOULD EXPECT: Some feelings of bloating in the abdomen. Passage of more gas than usual.  Walking can help get rid of the air that was put into your GI tract during the procedure and reduce the bloating. If you had a lower endoscopy (such as a colonoscopy or flexible sigmoidoscopy) you may notice spotting of blood in your stool or on the toilet paper. If you underwent a bowel prep for your procedure, then you may not have a normal bowel movement for a few days.  DIET: Your first meal following the procedure should be a light meal and then it is ok to progress to your normal diet.  A half-sandwich or bowl of soup is an example of a good first meal.  Heavy or fried foods are harder to digest and may make you feel nauseous or bloated.  Likewise meals heavy in dairy and vegetables can cause extra gas to form and this can also increase the bloating.  Drink plenty of fluids but you should avoid alcoholic beverages for 24 hours.  ACTIVITY: Your care partner should take you home directly after the procedure.  You should plan to take it easy, moving slowly for the rest of the day.  You can resume normal activity the day after the procedure however you should NOT DRIVE or use heavy machinery for 24 hours (because of the sedation medicines used during the test).    SYMPTOMS TO REPORT IMMEDIATELY: A gastroenterologist can be reached at any hour.  During normal business hours, 8:30 AM to 5:00 PM Monday through Friday,  call [phone].  After hours and on weekends, please call the GI answering service at [phone] who will take a message and have the physician on call contact you.    Following upper endoscopy (EGD)  Vomiting of blood or coffee ground material  New chest pain or pain under the shoulder blades  Painful or persistently difficult swallowing  New shortness of breath  Fever of 100F or higher  Black, tarry-looking stools  FOLLOW UP: If any biopsies were taken you will be contacted by phone or by letter within the next 1-3 weeks.  Call your gastroenterologist if you have not heard about the biopsies in 3 weeks.  Our staff will call the home number listed on your records the next business day following your procedure to check on you and address any questions or concerns that you may have at that time regarding the information given to you following your procedure. This is a courtesy call and so if there is no answer at the home number and we have not heard from you through the emergency physician on call, we will assume that you have returned to your regular daily activities without incident.  SIGNATURES/CONFIDENTIALITY: You and/or your care partner have signed paperwork which will be entered into your electronic medical record.  These signatures attest to the fact that that the information above on your After Visit Summary has been reviewed and is understood.  Full   of the confidentiality of this discharge information lies with you and/or your care-partner.   Resume medications. Information given on Barrett's and Hiatal Hernia with discharge instructions. 

## 2013-10-16 NOTE — Progress Notes (Signed)
HOB elevated suctioned - Stable to RR

## 2013-10-17 ENCOUNTER — Telehealth: Payer: Self-pay | Admitting: *Deleted

## 2013-10-17 NOTE — Telephone Encounter (Signed)
  Follow up Call-  Call back number 10/16/2013 12/18/2011 01/27/2011  Post procedure Call Back phone  # [phone]   Permission to leave phone message Yes Yes -     Patient questions:  Do you have a fever, pain , or abdominal swelling? no Pain Score  0 *  Have you tolerated food without any problems? yes  Have you been able to return to your normal activities? yes  Do you have any questions about your discharge instructions: Diet   no Medications  no Follow up visit  no  Do you have questions or concerns about your Care? no  Actions: * If pain score is 4 or above: No action needed, pain <4.

## 2013-10-22 ENCOUNTER — Encounter: Payer: Self-pay | Admitting: Gastroenterology

## 2013-11-10 DIAGNOSIS — L821 Other seborrheic keratosis: Secondary | ICD-10-CM | POA: Diagnosis not present

## 2013-11-10 DIAGNOSIS — D234 Other benign neoplasm of skin of scalp and neck: Secondary | ICD-10-CM | POA: Diagnosis not present

## 2013-11-10 DIAGNOSIS — L719 Rosacea, unspecified: Secondary | ICD-10-CM | POA: Diagnosis not present

## 2013-12-10 DIAGNOSIS — Z9189 Other specified personal risk factors, not elsewhere classified: Secondary | ICD-10-CM | POA: Diagnosis not present

## 2013-12-10 DIAGNOSIS — Z124 Encounter for screening for malignant neoplasm of cervix: Secondary | ICD-10-CM | POA: Diagnosis not present

## 2014-08-03 DIAGNOSIS — Z23 Encounter for immunization: Secondary | ICD-10-CM | POA: Diagnosis not present

## 2014-08-12 ENCOUNTER — Encounter: Payer: Self-pay | Admitting: Internal Medicine

## 2014-08-12 ENCOUNTER — Telehealth: Payer: Self-pay | Admitting: Internal Medicine

## 2014-08-12 ENCOUNTER — Ambulatory Visit (INDEPENDENT_AMBULATORY_CARE_PROVIDER_SITE_OTHER): Payer: Medicare Other | Admitting: Internal Medicine

## 2014-08-12 VITALS — BP 140/86 | HR 79 | Temp 98.3°F | Resp 14 | Ht 60.0 in | Wt 153.4 lb

## 2014-08-12 DIAGNOSIS — M8580 Other specified disorders of bone density and structure, unspecified site: Secondary | ICD-10-CM | POA: Diagnosis not present

## 2014-08-12 DIAGNOSIS — Z8249 Family history of ischemic heart disease and other diseases of the circulatory system: Secondary | ICD-10-CM | POA: Diagnosis not present

## 2014-08-12 DIAGNOSIS — R030 Elevated blood-pressure reading, without diagnosis of hypertension: Secondary | ICD-10-CM

## 2014-08-12 DIAGNOSIS — L719 Rosacea, unspecified: Secondary | ICD-10-CM

## 2014-08-12 DIAGNOSIS — K2270 Barrett's esophagus without dysplasia: Secondary | ICD-10-CM

## 2014-08-12 NOTE — Progress Notes (Signed)
Pre visit review using our clinic review tool, if applicable. No additional management support is needed unless otherwise documented below in the visit note. 

## 2014-08-12 NOTE — Patient Instructions (Signed)
Minimal Blood Pressure Goal= AVERAGE < 140/90;  Ideal is an AVERAGE < 135/85. This AVERAGE should be calculated from @ least 5-7 BP readings taken @ different times of day on different days of week. You should not respond to isolated BP readings , but rather the AVERAGE for that week .Please bring your  blood pressure cuff to office visits to verify that it is reliable.It  can also be checked against the blood pressure device at the pharmacy. Finger or wrist cuffs are not dependable; an arm cuff is.  Your next office appointment will be determined based upon review of your pending labs. Those instructions will be transmitted to you through My Chart  OR  by mail;whichever process is your choice to receive results & recommendations .  

## 2014-08-12 NOTE — Assessment & Plan Note (Signed)
BMET 

## 2014-08-12 NOTE — Progress Notes (Signed)
   Subjective:    Patient ID: Nancy Carr, female    DOB: June 19, 1946, 68 y.o.   MRN: [id number]  HPI  She is here to assess active health issues & conditions. PMH, FH, & Social history verified & updated   She is on a heart healthy diet; she walks 2 miles per day 5 days a week without cardiopulmonary symptoms.  There is some family history of premature coronary disease  She has had a villous adenoma remotely; her last colonoscopy was negative.  She's also has Barrett's esophagus. She has no symptoms on over-the-counter Zegerid.    Review of Systems   Chest pain, palpitations, tachycardia, exertional dyspnea, paroxysmal nocturnal dyspnea, claudication or edema are absent.  Unexplained weight loss, abdominal pain, significant dyspepsia, dysphagia, melena, rectal bleeding, or persistently small caliber stools are denied.     Objective:   Physical Exam Gen.: Healthy and well-nourished in appearance. Alert, appropriate and cooperative throughout exam. Appears younger than stated age  Head: Normocephalic without obvious abnormalities  Eyes: No corneal or conjunctival inflammation noted. Pupils equal round reactive to light and accommodation. Extraocular motion intact. Ptosis OD > OS. Ears: External  ear exam reveals no significant lesions or deformities. Canals clear .TMs normal. Hearing is grossly normal bilaterally. Nose: External nasal exam reveals no deformity or inflammation. Nasal mucosa are pink and moist. No lesions or exudates noted.   Mouth: Oral mucosa and oropharynx reveal no lesions or exudates. Teeth in good repair. Neck: No deformities, masses, or tenderness noted. Range of motion &. Thyroid normal. Lungs: Normal respiratory effort; chest expands symmetrically. Lungs are clear to auscultation without rales, wheezes, or increased work of breathing. Heart: Normal rate and rhythm. Normal S1 and S2. No gallop, click, or rub. No murmur. Abdomen: Bowel sounds normal;  abdomen soft and nontender. No masses, organomegaly or hernias noted. Genitalia: as per Gyn                                  Musculoskeletal/extremities:  Accentuated curvature of upper thoracic spine. No clubbing, cyanosis, edema, or significant extremity  deformity noted. Range of motion normal .Tone & strength normal. 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  Fingernail  health good. Able to lie down & sit up w/o help. Negative SLR bilaterally Vascular: Carotid, radial artery, dorsalis pedis and  posterior tibial pulses are full and equal. No bruits present. Neurologic: Alert and oriented x3. Deep tendon reflexes symmetrical and normal.  Gait normal.     Skin: Intact without suspicious lesions or rashes. Lymph: No cervical, axillary lymphadenopathy present. Psych: Mood and affect are normal. Normally interactive                                                                                        Assessment & Plan:  See Current Assessment & Plan in Problem List under specific Diagnosis

## 2014-08-12 NOTE — Assessment & Plan Note (Signed)
Vit D level.

## 2014-08-12 NOTE — Assessment & Plan Note (Signed)
Lipids 

## 2014-08-12 NOTE — Assessment & Plan Note (Signed)
CBC

## 2014-08-24 ENCOUNTER — Telehealth: Payer: Self-pay | Admitting: Internal Medicine

## 2014-08-24 NOTE — Telephone Encounter (Signed)
OK 

## 2014-08-24 NOTE — Telephone Encounter (Signed)
Patient is requesting pneumonia inj.  Please advise.

## 2014-08-25 ENCOUNTER — Other Ambulatory Visit (INDEPENDENT_AMBULATORY_CARE_PROVIDER_SITE_OTHER): Payer: Medicare Other

## 2014-08-25 DIAGNOSIS — R030 Elevated blood-pressure reading, without diagnosis of hypertension: Secondary | ICD-10-CM

## 2014-08-25 DIAGNOSIS — K2270 Barrett's esophagus without dysplasia: Secondary | ICD-10-CM | POA: Diagnosis not present

## 2014-08-25 LAB — CBC WITH DIFFERENTIAL/PLATELET
BASOS PCT: 0.1 % (ref 0.0–3.0)
Basophils Absolute: 0 10*3/uL (ref 0.0–0.1)
EOS PCT: 2.8 % (ref 0.0–5.0)
Eosinophils Absolute: 0.1 10*3/uL (ref 0.0–0.7)
HEMATOCRIT: 44.2 % (ref 36.0–46.0)
HEMOGLOBIN: 14.8 g/dL (ref 12.0–15.0)
LYMPHS ABS: 1.1 10*3/uL (ref 0.7–4.0)
Lymphocytes Relative: 26.5 % (ref 12.0–46.0)
MCHC: 33.5 g/dL (ref 30.0–36.0)
MCV: 93.6 fl (ref 78.0–100.0)
MONO ABS: 0.4 10*3/uL (ref 0.1–1.0)
MONOS PCT: 9.7 % (ref 3.0–12.0)
NEUTROS ABS: 2.6 10*3/uL (ref 1.4–7.7)
Neutrophils Relative %: 60.9 % (ref 43.0–77.0)
PLATELETS: 288 10*3/uL (ref 150.0–400.0)
RBC: 4.72 Mil/uL (ref 3.87–5.11)
RDW: 13.2 % (ref 11.5–15.5)
WBC: 4.3 10*3/uL (ref 4.0–10.5)

## 2014-08-25 LAB — BASIC METABOLIC PANEL
BUN: 18 mg/dL (ref 6–23)
CHLORIDE: 106 meq/L (ref 96–112)
CO2: 26 mEq/L (ref 19–32)
Calcium: 9.3 mg/dL (ref 8.4–10.5)
Creatinine, Ser: 0.7 mg/dL (ref 0.4–1.2)
GFR: 83.89 mL/min (ref >60.00)
Glucose, Bld: 101 mg/dL — ABNORMAL HIGH (ref 70–99)
POTASSIUM: 4.6 meq/L (ref 3.5–5.1)
SODIUM: 137 meq/L (ref 135–145)

## 2014-08-25 NOTE — Telephone Encounter (Signed)
Please schedule for Prevnar 13

## 2014-08-26 ENCOUNTER — Telehealth: Payer: Self-pay | Admitting: Internal Medicine

## 2014-08-26 NOTE — Telephone Encounter (Signed)
Left vm to call back to schedule  °

## 2014-08-26 NOTE — Telephone Encounter (Signed)
How much is shingles inj out of pocket?

## 2014-08-28 ENCOUNTER — Encounter: Payer: Self-pay | Admitting: Internal Medicine

## 2014-08-28 DIAGNOSIS — R739 Hyperglycemia, unspecified: Secondary | ICD-10-CM | POA: Insufficient documentation

## 2014-09-08 ENCOUNTER — Ambulatory Visit (INDEPENDENT_AMBULATORY_CARE_PROVIDER_SITE_OTHER): Payer: Medicare Other | Admitting: *Deleted

## 2014-09-08 DIAGNOSIS — Z23 Encounter for immunization: Secondary | ICD-10-CM | POA: Diagnosis not present

## 2014-09-09 ENCOUNTER — Ambulatory Visit: Payer: Medicare Other

## 2014-09-21 ENCOUNTER — Other Ambulatory Visit: Payer: Self-pay

## 2014-09-21 DIAGNOSIS — Z1231 Encounter for screening mammogram for malignant neoplasm of breast: Secondary | ICD-10-CM

## 2014-09-21 NOTE — Telephone Encounter (Signed)
Approximately $300

## 2014-09-22 NOTE — Telephone Encounter (Signed)
Left vm

## 2014-10-12 ENCOUNTER — Encounter (INDEPENDENT_AMBULATORY_CARE_PROVIDER_SITE_OTHER): Payer: Self-pay

## 2014-10-12 ENCOUNTER — Ambulatory Visit
Admission: RE | Admit: 2014-10-12 | Discharge: 2014-10-12 | Disposition: A | Discharge status: Home / Self Care | Payer: Medicare Other | Source: Ambulatory Visit

## 2014-10-12 DIAGNOSIS — Z1231 Encounter for screening mammogram for malignant neoplasm of breast: Secondary | ICD-10-CM

## 2014-11-10 DIAGNOSIS — L718 Other rosacea: Secondary | ICD-10-CM | POA: Diagnosis not present

## 2014-11-10 DIAGNOSIS — L821 Other seborrheic keratosis: Secondary | ICD-10-CM | POA: Diagnosis not present

## 2014-11-10 DIAGNOSIS — D1801 Hemangioma of skin and subcutaneous tissue: Secondary | ICD-10-CM | POA: Diagnosis not present

## 2014-11-18 ENCOUNTER — Ambulatory Visit (INDEPENDENT_AMBULATORY_CARE_PROVIDER_SITE_OTHER): Payer: Medicare Other | Admitting: Internal Medicine

## 2014-11-18 ENCOUNTER — Encounter: Payer: Self-pay | Admitting: Internal Medicine

## 2014-11-18 VITALS — BP 120/88 | HR 75 | Temp 98.1°F | Ht 60.0 in | Wt 152.0 lb

## 2014-11-18 DIAGNOSIS — J3089 Other allergic rhinitis: Secondary | ICD-10-CM

## 2014-11-18 DIAGNOSIS — R05 Cough: Secondary | ICD-10-CM

## 2014-11-18 DIAGNOSIS — J029 Acute pharyngitis, unspecified: Secondary | ICD-10-CM | POA: Diagnosis not present

## 2014-11-18 DIAGNOSIS — R059 Cough, unspecified: Secondary | ICD-10-CM

## 2014-11-18 MED ORDER — AZITHROMYCIN 250 MG PO TABS: ORAL_TABLET | ORAL | Status: DC

## 2014-11-18 MED ORDER — BENZONATATE 200 MG PO CAPS: 200.0000 mg | ORAL_CAPSULE | Freq: Two times a day (BID) | ORAL | Status: DC | PRN

## 2014-11-18 NOTE — Progress Notes (Signed)
   Subjective:    Patient ID: Nancy Carr, female    DOB: 1946/06/18, 69 y.o.   MRN: [id number]  HPI Symptoms began 11/12/14 as chills without associated fever or sweats. As of 2/5 she had a sore throat which has progressed. This is in the context of clear rhinitis with postnasal drainage. She has some coughing during the day but it is severe at night. It remains nonproductive.  She also describes some sneezing and itchy, watery eyes.   Review of Systems She denies frontal headache or facial pain. She's had no nasal purulence. She has no otic pain or otic discharge. The cough is not associated with wheezing or shortness of breath.    Objective:   Physical Exam  General appearance:Adequately nourished; no acute distress or increased work of breathing is present.  No  lymphadenopathy about the head, neck, or axilla noted.   Eyes: No conjunctival inflammation or lid edema is present. There is no scleral icterus.  Ears:  External ear exam shows no significant lesions or deformities.  Otoscopic examination reveals clear canals, tympanic membranes are intact bilaterally without bulging, retraction, inflammation or discharge.  Nose:  External nasal examination shows no deformity or inflammation. Nasal mucosa are pink and moist without lesions or exudates. No septal dislocation or deviation.No obstruction to airflow.   Oral exam: Dental hygiene is good; lips and gums are healthy appearing.There is no oropharyngeal erythema or exudate noted.   Neck:  No deformities, thyromegaly, masses, or tenderness noted.   Supple with full range of motion without pain.   Heart:  Normal rate and regular rhythm. S1 and S2 normal without gallop, murmur, click, rub or other extra sounds.   Lungs:Chest clear to auscultation; no wheezes or rhonchi. Minor rales @ the bases  Extremities:  No cyanosis, edema, or clubbing  noted    Skin: Warm & dry w/o jaundice or tenting.       Assessment & Plan:  #1  allergic rhinitis See orders & AVS

## 2014-11-18 NOTE — Progress Notes (Signed)
Pre visit review using our clinic review tool, if applicable. No additional management support is needed unless otherwise documented below in the visit note. 

## 2014-11-18 NOTE — Patient Instructions (Signed)
Plain Mucinex (NOT D) for thick secretions ;force NON dairy fluids .   Nasal cleansing in the shower as discussed with lather of mild shampoo.After 10 seconds wash off lather while  exhaling through nostrils. Make sure that all residual soap is removed to prevent irritation.  Flonase OR Nasacort AQ 1 spray in each nostril twice a day as needed. Use the "crossover" technique into opposite nostril spraying toward opposite ear @ 45 degree angle, not straight up into nostril.  Plain Allegra (NOT D )  160 daily , Loratidine 10 mg , OR Zyrtec 10 mg @ bedtime  as needed for itchy eyes & sneezing.  Fill the  prescription for Zithromax it there is not dramatic improvement in the next 48 hours.

## 2014-12-15 ENCOUNTER — Other Ambulatory Visit: Payer: Self-pay | Admitting: Obstetrics and Gynecology

## 2014-12-15 DIAGNOSIS — Z779 Other contact with and (suspected) exposures hazardous to health: Secondary | ICD-10-CM | POA: Diagnosis not present

## 2014-12-15 DIAGNOSIS — M816 Localized osteoporosis [Lequesne]: Secondary | ICD-10-CM | POA: Diagnosis not present

## 2014-12-15 DIAGNOSIS — Z124 Encounter for screening for malignant neoplasm of cervix: Secondary | ICD-10-CM | POA: Diagnosis not present

## 2014-12-15 DIAGNOSIS — N958 Other specified menopausal and perimenopausal disorders: Secondary | ICD-10-CM | POA: Diagnosis not present

## 2014-12-15 DIAGNOSIS — Z6829 Body mass index (BMI) 29.0-29.9, adult: Secondary | ICD-10-CM | POA: Diagnosis not present

## 2014-12-16 LAB — CYTOLOGY - PAP

## 2015-02-02 ENCOUNTER — Encounter: Payer: Self-pay | Admitting: Internal Medicine

## 2015-02-02 ENCOUNTER — Ambulatory Visit (INDEPENDENT_AMBULATORY_CARE_PROVIDER_SITE_OTHER): Payer: Medicare Other | Admitting: Internal Medicine

## 2015-02-02 VITALS — BP 114/88 | HR 82 | Temp 98.3°F | Ht 60.0 in | Wt 149.2 lb

## 2015-02-02 DIAGNOSIS — F4329 Adjustment disorder with other symptoms: Secondary | ICD-10-CM | POA: Diagnosis not present

## 2015-02-02 DIAGNOSIS — J018 Other acute sinusitis: Secondary | ICD-10-CM

## 2015-02-02 MED ORDER — BENZONATATE 200 MG PO CAPS: 200.0000 mg | ORAL_CAPSULE | Freq: Three times a day (TID) | ORAL | Status: DC | PRN

## 2015-02-02 MED ORDER — AMOXICILLIN 500 MG PO CAPS: 500.0000 mg | ORAL_CAPSULE | Freq: Three times a day (TID) | ORAL | Status: DC

## 2015-02-02 MED ORDER — SERTRALINE HCL 50 MG PO TABS
ORAL_TABLET | ORAL | Status: DC
Start: 2015-02-02 — End: 2016-11-13

## 2015-02-02 NOTE — Progress Notes (Signed)
Pre visit review using our clinic review tool, if applicable. No additional management support is needed unless otherwise documented below in the visit note. 

## 2015-02-02 NOTE — Progress Notes (Signed)
   Subjective:    Patient ID: Nancy Carr, female    DOB: 1946-02-12, 69 y.o.   MRN: [id number]  HPI Symptoms began 01/29/15 as rhinitis with sneezing & fever. Cough has been productive of clear sputum. She's had associated watery eyes ; sneezing and postnasal drainage. She has major rhinosinusitis symptoms of congestion, obstruction, and loss of smell. Minor symptoms include fatigue, maxillary sinus pain and pressure in the ears.  She's been taking Tessalon for cough with some benefit. She's also been using Advil, Tylenol, and Zyrtec.  She's under major stress as her husband has metastatic prostate cancer. She is requesting some medication to help her  "mild anxiety"   Review of Systems She is not having otic pain, otic discharge, nasal purulence, fever, chills, or sweats. The cough is not associated with shortness of breath or wheezing.  She's had some dyspepsia which is controlled with over-the-counter Zegerid.    Objective:   Physical Exam   General appearance:Adequately nourished; no acute distress or increased work of breathing is present.   BMI:  Lymphatic: No  lymphadenopathy about the head, neck, or axilla .  Eyes: No conjunctival inflammation or lid edema is present. There is no scleral icterus.  Ears:  External ear exam shows no significant lesions or deformities.  Otoscopic examination reveals clear canals, tympanic membranes are intact bilaterally without bulging, retraction, inflammation or discharge.  Nose:  External nasal examination shows no deformity or inflammation. Nasal mucosa are pink and moist without lesions or exudates.The nasal septum reveals no significant erythema.No septal dislocation or deviation.No obstruction to airflow.   Oral exam: Dental hygiene is good; lips and gums are healthy appearing.There is no oropharyngeal erythema or exudate .  Neck:  No deformities, thyromegaly, masses, or tenderness noted.   Supple with full range of motion without  pain.   Heart:  Normal rate and regular rhythm. S1 and S2 normal without gallop, murmur, click, rub or other extra sounds.   Lungs:Chest clear to auscultation; no wheezes, rhonchi,rales ,or rubs present.  Extremities:  No cyanosis, edema, or clubbing  noted    Skin: Warm & dry w/o tenting or jaundice. No significant lesions or rash.  She's tearful as she describes the process her husband is undergoing.       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 See prescription medications The pathophysiology of neurotransmitter deficiency was discussed along with the benefits and potential adverse effects of SSRI therapy.

## 2015-02-02 NOTE — Patient Instructions (Signed)
Please reconsider taking the agent to raise the neurotransmitters which are essential for good brain function, both intellectual & emotional health. These agents are not addictive and simply keep this essential neurotransmitter at therapeutic levels. If these levels become severely depleted; depression or panic attacks can occur.

## 2015-08-12 DIAGNOSIS — Z23 Encounter for immunization: Secondary | ICD-10-CM | POA: Diagnosis not present

## 2015-09-22 ENCOUNTER — Other Ambulatory Visit: Payer: Self-pay

## 2015-09-22 DIAGNOSIS — Z1231 Encounter for screening mammogram for malignant neoplasm of breast: Secondary | ICD-10-CM

## 2015-10-21 ENCOUNTER — Ambulatory Visit
Admission: RE | Admit: 2015-10-21 | Discharge: 2015-10-21 | Disposition: A | Discharge status: Home / Self Care | Payer: Medicare Other | Source: Ambulatory Visit

## 2015-10-21 DIAGNOSIS — Z1231 Encounter for screening mammogram for malignant neoplasm of breast: Secondary | ICD-10-CM | POA: Diagnosis not present

## 2015-11-07 ENCOUNTER — Other Ambulatory Visit: Payer: Self-pay | Admitting: Internal Medicine

## 2015-12-16 DIAGNOSIS — Z779 Other contact with and (suspected) exposures hazardous to health: Secondary | ICD-10-CM | POA: Diagnosis not present

## 2015-12-16 DIAGNOSIS — Z6829 Body mass index (BMI) 29.0-29.9, adult: Secondary | ICD-10-CM | POA: Diagnosis not present

## 2015-12-16 DIAGNOSIS — Z124 Encounter for screening for malignant neoplasm of cervix: Secondary | ICD-10-CM | POA: Diagnosis not present

## 2016-02-11 DIAGNOSIS — H25813 Combined forms of age-related cataract, bilateral: Secondary | ICD-10-CM | POA: Diagnosis not present

## 2016-02-11 DIAGNOSIS — H1859 Other hereditary corneal dystrophies: Secondary | ICD-10-CM | POA: Diagnosis not present

## 2016-02-11 DIAGNOSIS — H04123 Dry eye syndrome of bilateral lacrimal glands: Secondary | ICD-10-CM | POA: Diagnosis not present

## 2016-02-11 DIAGNOSIS — H524 Presbyopia: Secondary | ICD-10-CM | POA: Diagnosis not present

## 2016-07-13 DIAGNOSIS — Z23 Encounter for immunization: Secondary | ICD-10-CM | POA: Diagnosis not present

## 2016-08-15 ENCOUNTER — Encounter: Payer: Self-pay | Admitting: Gastroenterology

## 2016-09-27 ENCOUNTER — Encounter: Payer: Self-pay | Admitting: Gastroenterology

## 2016-09-27 ENCOUNTER — Other Ambulatory Visit: Payer: Self-pay | Admitting: Obstetrics and Gynecology

## 2016-09-27 DIAGNOSIS — Z1231 Encounter for screening mammogram for malignant neoplasm of breast: Secondary | ICD-10-CM

## 2016-10-30 ENCOUNTER — Ambulatory Visit
Admission: RE | Admit: 2016-10-30 | Discharge: 2016-10-30 | Disposition: A | Discharge status: Home / Self Care | Payer: Medicare Other | Source: Ambulatory Visit | Attending: Obstetrics and Gynecology | Admitting: Obstetrics and Gynecology

## 2016-10-30 DIAGNOSIS — Z1231 Encounter for screening mammogram for malignant neoplasm of breast: Secondary | ICD-10-CM | POA: Diagnosis not present

## 2016-11-01 ENCOUNTER — Other Ambulatory Visit: Payer: Self-pay | Admitting: Obstetrics and Gynecology

## 2016-11-01 DIAGNOSIS — R928 Other abnormal and inconclusive findings on diagnostic imaging of breast: Secondary | ICD-10-CM

## 2016-11-03 ENCOUNTER — Ambulatory Visit
Admission: RE | Admit: 2016-11-03 | Discharge: 2016-11-03 | Disposition: A | Discharge status: Home / Self Care | Payer: Medicare Other | Source: Ambulatory Visit | Attending: Obstetrics and Gynecology | Admitting: Obstetrics and Gynecology

## 2016-11-03 DIAGNOSIS — R928 Other abnormal and inconclusive findings on diagnostic imaging of breast: Secondary | ICD-10-CM

## 2016-11-08 ENCOUNTER — Telehealth: Payer: Self-pay | Admitting: *Deleted

## 2016-11-08 NOTE — Telephone Encounter (Signed)
Thanks for checking on that.    I reviewed the 2015 EGD report. The photos are not convincing for Barrett's, but the pathology report shows it.  Yes, I think it would be best to have the EGD done same time as colonoscopy.  Surveillance EGD for Barrett's should be 2-3 yrs, so we are in that time frame.  - HD

## 2016-11-08 NOTE — Telephone Encounter (Signed)
Patient called, new appointment made. Pt aware.

## 2016-11-08 NOTE — Telephone Encounter (Signed)
Patient called, no answer. Left message for patient to call me back.

## 2016-11-08 NOTE — Telephone Encounter (Signed)
Dr.Danis, Patient is coming in for recall colonoscopy, hx polyps former [facility] patient. When reviewing her chart I see her last EGD was 10/16/2013 bx's showed Barrett's and she had a 3 year recall. Is it okay if patient has recall EGD same time as her colonoscopy? Thank, Dalyce Renne PV

## 2016-11-13 ENCOUNTER — Ambulatory Visit (AMBULATORY_SURGERY_CENTER): Payer: Self-pay | Admitting: *Deleted

## 2016-11-13 VITALS — Ht 61.0 in | Wt 155.2 lb

## 2016-11-13 DIAGNOSIS — K2270 Barrett's esophagus without dysplasia: Secondary | ICD-10-CM

## 2016-11-13 DIAGNOSIS — L821 Other seborrheic keratosis: Secondary | ICD-10-CM | POA: Diagnosis not present

## 2016-11-13 DIAGNOSIS — L570 Actinic keratosis: Secondary | ICD-10-CM | POA: Diagnosis not present

## 2016-11-13 DIAGNOSIS — Z86010 Personal history of colonic polyps: Secondary | ICD-10-CM

## 2016-11-13 NOTE — Progress Notes (Signed)
Patient denies any allergies to eggs and soy. No diet pills. Patient denies any problems with anesthesia/sedation.  Patient declined emmi education video. Patient request the miralax prep. She states she was unable to tolerate the Moviprep last time.

## 2016-12-06 ENCOUNTER — Encounter: Payer: Medicare Other | Admitting: Gastroenterology

## 2016-12-11 ENCOUNTER — Ambulatory Visit (AMBULATORY_SURGERY_CENTER): Payer: Medicare Other | Admitting: Gastroenterology

## 2016-12-11 ENCOUNTER — Encounter: Payer: Self-pay | Admitting: Gastroenterology

## 2016-12-11 VITALS — BP 121/73 | HR 60 | Temp 98.4°F | Resp 15 | Ht 61.0 in | Wt 155.0 lb

## 2016-12-11 DIAGNOSIS — K2270 Barrett's esophagus without dysplasia: Secondary | ICD-10-CM | POA: Diagnosis not present

## 2016-12-11 DIAGNOSIS — Z860100 Personal history of colon polyps, unspecified: Secondary | ICD-10-CM

## 2016-12-11 DIAGNOSIS — Z8719 Personal history of other diseases of the digestive system: Secondary | ICD-10-CM | POA: Diagnosis not present

## 2016-12-11 MED ORDER — SODIUM CHLORIDE 0.9 % IV SOLN: 500.0000 mL | INTRAVENOUS | Status: DC

## 2016-12-11 NOTE — Progress Notes (Signed)
Patient stating she was unable to complete her prep related to nausea. Taking a friends, "anti nausea" medication. Still unable to complete prep, brown semi form stool at one time today.informed Dr Loletha Carrow. Will proceed with Colonoscopy.

## 2016-12-11 NOTE — Op Note (Signed)
[facility] Patient Name: Nancy Carr Procedure Date: 12/11/2016 2:07 PM MRN: [id number] Endoscopist: [place]. Loletha Carrow , MD Age: 71 Referring MD:  Date of Birth: 05-15-1946 Gender: Female Account #: [id number] Procedure:                Colonoscopy Indications:              Surveillance: Personal history of adenomatous                            polyps on last colonoscopy > 5 years ago                            (inadequate prep on 2012 colonoscopy) Medicines:                Monitored Anesthesia Care Procedure:                Pre-Anesthesia Assessment:                           - Prior to the procedure, a History and Physical                            was performed, and patient medications and                            allergies were reviewed. The patient's tolerance of                            previous anesthesia was also reviewed. The risks                            and benefits of the procedure and the sedation                            options and risks were discussed with the patient.                            All questions were answered, and informed consent                            was obtained. Prior Anticoagulants: The patient has                            taken no previous anticoagulant or antiplatelet                            agents. ASA Grade Assessment: II - A patient with                            mild systemic disease. After reviewing the risks                            and benefits, the patient was deemed in  satisfactory condition to undergo the procedure.                           After obtaining informed consent, the colonoscope                            was passed under direct vision. Throughout the                            procedure, the patient's blood pressure, pulse, and                            oxygen saturations were monitored continuously. The                            Model PCF-[id number])  scope was introduced                            through the anus and advanced to the the cecum,                            identified by appendiceal orifice and ileocecal                            valve. The colonoscopy was performed without                            difficulty. The patient tolerated the procedure                            well. The quality of the bowel preparation was                            good. The ileocecal valve, appendiceal orifice, and                            rectum were photographed. The bowel preparation                            used was SUPREP. Scope In: 2:22:44 PM Scope Out: 2:35:44 PM Scope Withdrawal Time: 0 hours 8 minutes 17 seconds  Total Procedure Duration: 0 hours 13 minutes 0 seconds  Findings:                 The perianal and digital rectal examinations were                            normal.                           Many small and large-mouthed diverticula were found                            in the left colon.  No additional abnormalities were found on                            retroflexion. Complications:            No immediate complications. Estimated Blood Loss:     Estimated blood loss: none. Impression:               - Diverticulosis in the left colon.                           - No specimens collected. Recommendation:           - Patient has a contact number available for                            emergencies. The signs and symptoms of potential                            delayed complications were discussed with the                            patient. Return to normal activities tomorrow.                            Written discharge instructions were provided to the                            patient.                           - Resume previous diet.                           - Continue present medications.                           - No recommendation at this time regarding repeat                             colonoscopy due to age. Henry L. Loletha Carrow, MD 12/11/2016 2:46:57 PM This report has been signed electronically.

## 2016-12-11 NOTE — Op Note (Signed)
[facility] Patient Name: Nancy Carr Procedure Date: 12/11/2016 2:07 PM MRN: [id number] Endoscopist: [place]. Loletha Carrow , MD Age: 71 Referring MD:  Date of Birth: May 13, 1946 Gender: Female Account #: [id number] Procedure:                Upper GI endoscopy Indications:              Surveillance for malignancy due to personal history                            of Barrett's esophagus Medicines:                Monitored Anesthesia Care Procedure:                Pre-Anesthesia Assessment:                           - Prior to the procedure, a History and Physical                            was performed, and patient medications and                            allergies were reviewed. The patient's tolerance of                            previous anesthesia was also reviewed. The risks                            and benefits of the procedure and the sedation                            options and risks were discussed with the patient.                            All questions were answered, and informed consent                            was obtained. Prior Anticoagulants: The patient has                            taken no previous anticoagulant or antiplatelet                            agents. ASA Grade Assessment: II - A patient with                            mild systemic disease. After reviewing the risks                            and benefits, the patient was deemed in                            satisfactory condition to undergo the procedure.  After obtaining informed consent, the endoscope was                            passed under direct vision. Throughout the                            procedure, the patient's blood pressure, pulse, and                            oxygen saturations were monitored continuously. The                            Model GIF-[id number]) scope was introduced                            through the mouth, and  advanced to the second part                            of duodenum. The upper GI endoscopy was                            accomplished without difficulty. The patient                            tolerated the procedure well. Scope In: Scope Out: Findings:                 A 5 cm hiatal hernia was present.                           Multiple semi-sessile fundic gland polyps were                            found in the gastric fundus and in the gastric body.                           The cardia and gastric fundus were normal on                            retroflexion.                           The in the duodenum was normal. Complications:            No immediate complications. Estimated Blood Loss:     Estimated blood loss: none. Impression:               - 5 cm hiatal hernia.                           - Multiple fundic gland polyps.                           - Normal.                           - No specimens collected.  Recommendation:           - Patient has a contact number available for                            emergencies. The signs and symptoms of potential                            delayed complications were discussed with the                            patient. Return to normal activities tomorrow.                            Written discharge instructions were provided to the                            patient.                           - Resume previous diet.                           - Continue present medications. PPI can be taken as                            needed for heartburn. No Barrett's esophagus was                            seen.                           - See the other procedure note for documentation of                            additional recommendations. Henry L. Loletha Carrow, MD 12/11/2016 2:43:59 PM This report has been signed electronically.

## 2016-12-11 NOTE — Patient Instructions (Signed)
YOU HAD AN ENDOSCOPIC PROCEDURE TODAY AT [facility]:   Refer to the procedure report that was given to you for any specific questions about what was found during the examination.  If the procedure report does not answer your questions, please call your gastroenterologist to clarify.  If you requested that your care partner not be given the details of your procedure findings, then the procedure report has been included in a sealed envelope for you to review at your convenience later.  YOU SHOULD EXPECT: Some feelings of bloating in the abdomen. Passage of more gas than usual.  Walking can help get rid of the air that was put into your GI tract during the procedure and reduce the bloating. If you had a lower endoscopy (such as a colonoscopy or flexible sigmoidoscopy) you may notice spotting of blood in your stool or on the toilet paper. If you underwent a bowel prep for your procedure, you may not have a normal bowel movement for a few days.  Please Note:  You might notice some irritation and congestion in your nose or some drainage.  This is from the oxygen used during your procedure.  There is no need for concern and it should clear up in a day or so.  SYMPTOMS TO REPORT IMMEDIATELY:   Following lower endoscopy (colonoscopy or flexible sigmoidoscopy):  Excessive amounts of blood in the stool  Significant tenderness or worsening of abdominal pains  Swelling of the abdomen that is new, acute  Fever of 100F or higher   Following upper endoscopy (EGD)  Vomiting of blood or coffee ground material  New chest pain or pain under the shoulder blades  Painful or persistently difficult swallowing  New shortness of breath  Fever of 100F or higher  Black, tarry-looking stools  For urgent or emergent issues, a gastroenterologist can be reached at any hour by calling [phone].   DIET:  We do recommend a small meal at first, but then you may proceed to your regular diet.  Drink  plenty of fluids but you should avoid alcoholic beverages for 24 hours.  ACTIVITY:  You should plan to take it easy for the rest of today and you should NOT DRIVE or use heavy machinery until tomorrow (because of the sedation medicines used during the test).    FOLLOW UP: Our staff will call the number listed on your records the next business day following your procedure to check on you and address any questions or concerns that you may have regarding the information given to you following your procedure. If we do not reach you, we will leave a message.  However, if you are feeling well and you are not experiencing any problems, there is no need to return our call.  We will assume that you have returned to your regular daily activities without incident.  If any biopsies were taken you will be contacted by phone or by letter within the next 1-3 weeks.  Please call us at [phone] if you have not heard about the biopsies in 3 weeks.    SIGNATURES/CONFIDENTIALITY: You and/or your care partner have signed paperwork which will be entered into your electronic medical record.  These signatures attest to the fact that that the information above on your After Visit Summary has been reviewed and is understood.  Full responsibility of the confidentiality of this discharge information lies with you and/or your care-partner.   Resume medications. Information given on Hiatal Hernia and Diverticulosis.

## 2016-12-11 NOTE — Progress Notes (Signed)
Report given to PACU, vss 

## 2016-12-12 ENCOUNTER — Telehealth: Payer: Self-pay | Admitting: *Deleted

## 2016-12-12 NOTE — Telephone Encounter (Signed)
Left message on f/u call 

## 2016-12-18 DIAGNOSIS — M8588 Other specified disorders of bone density and structure, other site: Secondary | ICD-10-CM | POA: Diagnosis not present

## 2016-12-18 DIAGNOSIS — Z6829 Body mass index (BMI) 29.0-29.9, adult: Secondary | ICD-10-CM | POA: Diagnosis not present

## 2016-12-18 DIAGNOSIS — N958 Other specified menopausal and perimenopausal disorders: Secondary | ICD-10-CM | POA: Diagnosis not present

## 2016-12-18 DIAGNOSIS — Z779 Other contact with and (suspected) exposures hazardous to health: Secondary | ICD-10-CM | POA: Diagnosis not present

## 2016-12-18 DIAGNOSIS — Z124 Encounter for screening for malignant neoplasm of cervix: Secondary | ICD-10-CM | POA: Diagnosis not present

## 2017-01-16 DIAGNOSIS — J209 Acute bronchitis, unspecified: Secondary | ICD-10-CM | POA: Diagnosis not present

## 2017-02-21 ENCOUNTER — Telehealth: Payer: Self-pay | Admitting: General Practice

## 2017-02-21 NOTE — Telephone Encounter (Signed)
Nancy Carr called in waiting to become a patient of Dr. Quay Burow. She use to be a patient of Dr. Darrol Angel. She was informed Dr.Burns is not accepting new/transfer patients at this time. She states that her "Friends" see Dr. Quay Burow and they spoke with her about this. They informed her Dr.Burns would think about it. She REALLY wanted to speak or leave a message with Dr.Burns or Nurse about this. Patient was informed I could take the message and would let me know if Dr.Burns has changed. Thank you. Please advise.Marland Kitchen

## 2017-02-21 NOTE — Telephone Encounter (Signed)
I will accept her 

## 2017-02-21 NOTE — Telephone Encounter (Signed)
Please advise, Pt has not been seen since 2016.

## 2017-02-21 NOTE — Telephone Encounter (Signed)
LVM for pt to call back, will need to schedule transfer appt.

## 2017-02-22 NOTE — Telephone Encounter (Signed)
Pt has an appointment on 03/16/17.

## 2017-03-15 NOTE — Progress Notes (Signed)
Subjective:    Patient ID: Nancy Carr, female    DOB: 1946/04/14, 71 y.o.   MRN: [id number]  HPI She is here to establish with a new pcp.     GERD:  She is taking her medication as needed.  She rarely has GERD symptoms. She has a history of Barrett's and her last EGD showed no evidence of Barrett's.  She denies difficulty swallowing or abdominal pain.    Rosacea:  She follows with Dr Ronnald Ramp and uses topical metrocream as needed.   Osteopenia:  Her dexa is up to date and is ordered by Dr Radene Knee.  She is taking calcium and vitamin D.  She walks 5 days a week.    She thinks her cholesterol was slightly elevated in the past.  It has not been checked in a while.   Hyperglycemia:  She eats fairly well overall. She is exercising regularly.    Medications and allergies reviewed with patient and updated if appropriate.  Patient Active Problem List   Diagnosis Date Noted  . Hyperglycemia 08/28/2014  . Family history of premature coronary artery disease 08/12/2014  . Elevated blood pressure reading without diagnosis of hypertension 08/12/2014  . Barrett's esophagus 01/10/2012  . Rosacea 03/02/2011  . Osteopenia 08/31/2009  . COLONIC POLYPS, HX OF 08/31/2009    Current Outpatient Prescriptions on File Prior to Visit  Medication Sig Dispense Refill  . Calcium Carb-Cholecalciferol (CALCIUM-VITAMIN D) 600-400 MG-UNIT TABS Take 1 tablet by mouth 2 (two) times daily.    . metroNIDAZOLE (METROCREAM) 0.75 % cream     . omeprazole-sodium bicarbonate (ZEGERID) 40-1100 MG per capsule Take 1 capsule by mouth 2 (two) times daily. (Patient taking differently: Take 1 capsule by mouth daily. ) 180 capsule 4   No current facility-administered medications on file prior to visit.     Past Medical History:  Diagnosis Date  . GERD (gastroesophageal reflux disease)    mild  . Hx of colonic polyps 2007   villous adenoma   . Osteoporosis   . Rosacea     Past Surgical History:  Procedure  Laterality Date  . COLONOSCOPY    . COLONOSCOPY W/ POLYPECTOMY    . HYSTEROSCOPY    . POLYPECTOMY  2007   done by surgeon, Dr Dalbert Batman  . RECTAL POLYPECTOMY    . WISDOM TOOTH EXTRACTION      Social History   Social History  . Marital status: Widowed    Spouse name: N/A  . Number of children: 2  . Years of education: N/A   Occupational History  . Retired    Social History Main Topics  . Smoking status: Never Smoker  . Smokeless tobacco: Never Used  . Alcohol use 4.2 oz/week    7 Glasses of wine per week  . Drug use: No  . Sexual activity: Not on file   Other Topics Concern  . Not on file   Social History Narrative  . No narrative on file    Family History  Problem Relation Age of Onset  . Uterine cancer Sister        clear cell  . Melanoma Sister   . Hypertension Sister   . Asthma Sister   . Coronary artery disease Father        also Alsheimer's; Parkinson's  . Coronary artery disease Paternal Grandfather        also CVA  . Heart attack Maternal Grandfather        < 55  .  Stroke Paternal Grandmother        > 65; also Alsheimer's  . Dementia Mother        also Osteoporosis  . Irritable bowel syndrome Mother   . Heart attack Maternal Grandmother        < 65;also Alsheimer's  . Dementia Maternal Uncle   . Colon cancer Neg Hx   . Stomach cancer Neg Hx   . Esophageal cancer Neg Hx   . Diabetes Neg Hx     Review of Systems  Constitutional: Negative for chills and fever.  Respiratory: Negative for cough, shortness of breath and wheezing.   Cardiovascular: Negative for chest pain, palpitations and leg swelling.  Gastrointestinal: Negative for abdominal pain.  Neurological: Negative for light-headedness and headaches.       Objective:   Vitals:   03/16/17 1416  BP: 128/86  Pulse: 77  Resp: 16  Temp: 98.5 F (36.9 C)   Filed Weights   03/16/17 1416  Weight: 154 lb (69.9 kg)   Body mass index is 29.1 kg/m.  Wt Readings from Last 3 Encounters:   03/16/17 154 lb (69.9 kg)  12/11/16 155 lb (70.3 kg)  11/13/16 155 lb 3.2 oz (70.4 kg)     Physical Exam Constitutional: Appears well-developed and well-nourished. No distress.  HENT:  Head: Normocephalic and atraumatic.  Neck: Neck supple. No tracheal deviation present. No thyromegaly present.  No cervical lymphadenopathy Cardiovascular: Normal rate, regular rhythm and normal heart sounds.   No murmur heard. No carotid bruit .  No edema Pulmonary/Chest: Effort normal and breath sounds normal. No respiratory distress. No has no wheezes. No rales.  Skin: Skin is warm and dry. Not diaphoretic.  Psychiatric: Normal mood and affect. Behavior is normal.        Assessment & Plan:   Pneumovax today  See Problem List for Assessment and Plan of chronic medical problems.

## 2017-03-16 ENCOUNTER — Ambulatory Visit (INDEPENDENT_AMBULATORY_CARE_PROVIDER_SITE_OTHER): Payer: Medicare Other | Admitting: Internal Medicine

## 2017-03-16 ENCOUNTER — Encounter: Payer: Self-pay | Admitting: Internal Medicine

## 2017-03-16 VITALS — BP 128/86 | HR 77 | Temp 98.5°F | Resp 16 | Wt 154.0 lb

## 2017-03-16 DIAGNOSIS — M8580 Other specified disorders of bone density and structure, unspecified site: Secondary | ICD-10-CM | POA: Diagnosis not present

## 2017-03-16 DIAGNOSIS — Z8249 Family history of ischemic heart disease and other diseases of the circulatory system: Secondary | ICD-10-CM | POA: Diagnosis not present

## 2017-03-16 DIAGNOSIS — L719 Rosacea, unspecified: Secondary | ICD-10-CM | POA: Diagnosis not present

## 2017-03-16 DIAGNOSIS — K2270 Barrett's esophagus without dysplasia: Secondary | ICD-10-CM | POA: Diagnosis not present

## 2017-03-16 DIAGNOSIS — Z23 Encounter for immunization: Secondary | ICD-10-CM

## 2017-03-16 DIAGNOSIS — E7800 Pure hypercholesterolemia, unspecified: Secondary | ICD-10-CM | POA: Diagnosis not present

## 2017-03-16 DIAGNOSIS — Z1159 Encounter for screening for other viral diseases: Secondary | ICD-10-CM | POA: Diagnosis not present

## 2017-03-16 DIAGNOSIS — R739 Hyperglycemia, unspecified: Secondary | ICD-10-CM

## 2017-03-16 DIAGNOSIS — R030 Elevated blood-pressure reading, without diagnosis of hypertension: Secondary | ICD-10-CM | POA: Insufficient documentation

## 2017-03-16 NOTE — Assessment & Plan Note (Signed)
Metronidazole cream prn

## 2017-03-16 NOTE — Assessment & Plan Note (Signed)
Sees Dr Radene Knee Dexa done by gyn, up to date Taking calcium and vitamin d Walks 5 times a week

## 2017-03-16 NOTE — Patient Instructions (Addendum)
  Test(s) ordered today. Your results will be released to [place] (or called to you) after review, usually within 72hours after test completion. If any changes need to be made, you will be notified at that same time.  All other Health Maintenance issues reviewed.   All recommended immunizations and age-appropriate screenings are up-to-date or discussed.  Pneumovax immunization administered today.   Medications reviewed and updated.  No changes recommended at this time.    Please followup in annually

## 2017-03-17 NOTE — Assessment & Plan Note (Signed)
EGD done earlier this year - no Barrett's Takes zegerid prn only - rarely has GERD continue

## 2017-03-17 NOTE — Assessment & Plan Note (Signed)
?   Elevated cholesterol in the past - not checked recently Will order lipid panel, tsh

## 2017-03-19 DIAGNOSIS — H25813 Combined forms of age-related cataract, bilateral: Secondary | ICD-10-CM | POA: Diagnosis not present

## 2017-03-19 DIAGNOSIS — H52203 Unspecified astigmatism, bilateral: Secondary | ICD-10-CM | POA: Diagnosis not present

## 2017-03-19 DIAGNOSIS — H04123 Dry eye syndrome of bilateral lacrimal glands: Secondary | ICD-10-CM | POA: Diagnosis not present

## 2017-03-19 DIAGNOSIS — H43813 Vitreous degeneration, bilateral: Secondary | ICD-10-CM | POA: Diagnosis not present

## 2017-04-30 ENCOUNTER — Telehealth: Payer: Self-pay | Admitting: Internal Medicine

## 2017-04-30 NOTE — Telephone Encounter (Signed)
Patient is requesting a rx called in for CIPRO. She states in 3 weeks she is going out of town for vacation. She is requesting this medication in cause she needs it she states. Please advise. Thank you.   Patient was informed Dr.Burns was out of office until July 30th.

## 2017-05-01 NOTE — Telephone Encounter (Signed)
Pt States she uses it for Diarrhea. PLease send to [facility] on File.

## 2017-05-01 NOTE — Telephone Encounter (Signed)
Is it for a possible UTI or travelers diarrhea?  (different dose).  Confirm POF and I will send in.

## 2017-05-02 ENCOUNTER — Other Ambulatory Visit (INDEPENDENT_AMBULATORY_CARE_PROVIDER_SITE_OTHER): Payer: Medicare Other

## 2017-05-02 DIAGNOSIS — Z1159 Encounter for screening for other viral diseases: Secondary | ICD-10-CM

## 2017-05-02 DIAGNOSIS — M8580 Other specified disorders of bone density and structure, unspecified site: Secondary | ICD-10-CM | POA: Diagnosis not present

## 2017-05-02 DIAGNOSIS — R739 Hyperglycemia, unspecified: Secondary | ICD-10-CM | POA: Diagnosis not present

## 2017-05-02 DIAGNOSIS — K2270 Barrett's esophagus without dysplasia: Secondary | ICD-10-CM

## 2017-05-02 DIAGNOSIS — E7800 Pure hypercholesterolemia, unspecified: Secondary | ICD-10-CM | POA: Diagnosis not present

## 2017-05-02 DIAGNOSIS — R7303 Prediabetes: Secondary | ICD-10-CM

## 2017-05-02 DIAGNOSIS — Z8249 Family history of ischemic heart disease and other diseases of the circulatory system: Secondary | ICD-10-CM | POA: Diagnosis not present

## 2017-05-02 LAB — CBC WITH DIFFERENTIAL/PLATELET
BASOS ABS: 0 10*3/uL (ref 0.0–0.1)
BASOS PCT: 0.3 % (ref 0.0–3.0)
EOS PCT: 3.4 % (ref 0.0–5.0)
Eosinophils Absolute: 0.2 10*3/uL (ref 0.0–0.7)
HEMATOCRIT: 41.6 % (ref 36.0–46.0)
Hemoglobin: 13.8 g/dL (ref 12.0–15.0)
LYMPHS ABS: 1.3 10*3/uL (ref 0.7–4.0)
LYMPHS PCT: 27.4 % (ref 12.0–46.0)
MCHC: 33.2 g/dL (ref 30.0–36.0)
MCV: 90.8 fl (ref 78.0–100.0)
MONOS PCT: 9.2 % (ref 3.0–12.0)
Monocytes Absolute: 0.4 10*3/uL (ref 0.1–1.0)
NEUTROS ABS: 2.8 10*3/uL (ref 1.4–7.7)
NEUTROS PCT: 59.7 % (ref 43.0–77.0)
PLATELETS: 302 10*3/uL (ref 150.0–400.0)
RBC: 4.58 Mil/uL (ref 3.87–5.11)
RDW: 15.9 % — ABNORMAL HIGH (ref 11.5–15.5)
WBC: 4.6 10*3/uL (ref 4.0–10.5)

## 2017-05-02 LAB — LIPID PANEL
CHOL/HDL RATIO: 3
Cholesterol: 176 mg/dL (ref 0–200)
HDL: 56.7 mg/dL (ref >39.00)
LDL CALC: 93 mg/dL (ref 0–99)
NONHDL: 119.54
TRIGLYCERIDES: 135 mg/dL (ref 0.0–149.0)
VLDL: 27 mg/dL (ref 0.0–40.0)

## 2017-05-02 LAB — COMPREHENSIVE METABOLIC PANEL
ALT: 14 U/L (ref 0–35)
AST: 15 U/L (ref 0–37)
Albumin: 4 g/dL (ref 3.5–5.2)
Alkaline Phosphatase: 56 U/L (ref 39–117)
BILIRUBIN TOTAL: 0.4 mg/dL (ref 0.2–1.2)
BUN: 10 mg/dL (ref 6–23)
CALCIUM: 9.1 mg/dL (ref 8.4–10.5)
CHLORIDE: 105 meq/L (ref 96–112)
CO2: 30 meq/L (ref 19–32)
Creatinine, Ser: 0.7 mg/dL (ref 0.40–1.20)
GFR: 87.37 mL/min (ref >60.00)
GLUCOSE: 98 mg/dL (ref 70–99)
POTASSIUM: 4.1 meq/L (ref 3.5–5.1)
Sodium: 140 mEq/L (ref 135–145)
Total Protein: 7 g/dL (ref 6.0–8.3)

## 2017-05-02 LAB — TSH: TSH: 2.62 u[IU]/mL (ref 0.35–4.50)

## 2017-05-02 LAB — HEMOGLOBIN A1C: Hgb A1c MFr Bld: 6.1 % (ref 4.6–6.5)

## 2017-05-02 MED ORDER — CIPROFLOXACIN HCL 500 MG PO TABS: 500.0000 mg | ORAL_TABLET | Freq: Two times a day (BID) | ORAL | 0 refills | Status: DC

## 2017-05-02 NOTE — Telephone Encounter (Signed)
Sent to pof 

## 2017-05-03 LAB — HEPATITIS C ANTIBODY: HCV AB: NEGATIVE

## 2017-05-05 ENCOUNTER — Encounter: Payer: Self-pay | Admitting: Internal Medicine

## 2017-05-05 DIAGNOSIS — R7303 Prediabetes: Secondary | ICD-10-CM | POA: Insufficient documentation

## 2017-06-21 DIAGNOSIS — Z23 Encounter for immunization: Secondary | ICD-10-CM | POA: Diagnosis not present

## 2017-09-25 ENCOUNTER — Encounter: Payer: Self-pay | Admitting: Family Medicine

## 2017-09-25 ENCOUNTER — Ambulatory Visit (INDEPENDENT_AMBULATORY_CARE_PROVIDER_SITE_OTHER): Payer: Medicare Other | Admitting: Family Medicine

## 2017-09-25 ENCOUNTER — Other Ambulatory Visit (INDEPENDENT_AMBULATORY_CARE_PROVIDER_SITE_OTHER): Payer: Medicare Other

## 2017-09-25 VITALS — BP 128/72 | HR 104 | Temp 99.8°F | Ht 61.0 in | Wt 154.0 lb

## 2017-09-25 DIAGNOSIS — R1084 Generalized abdominal pain: Secondary | ICD-10-CM

## 2017-09-25 LAB — CBC WITH DIFFERENTIAL/PLATELET
BASOS PCT: 0.1 % (ref 0.0–3.0)
Basophils Absolute: 0 10*3/uL (ref 0.0–0.1)
EOS ABS: 0 10*3/uL (ref 0.0–0.7)
Eosinophils Relative: 0.6 % (ref 0.0–5.0)
HEMATOCRIT: 43 % (ref 36.0–46.0)
Hemoglobin: 14.1 g/dL (ref 12.0–15.0)
LYMPHS PCT: 13.4 % (ref 12.0–46.0)
Lymphs Abs: 1 10*3/uL (ref 0.7–4.0)
MCHC: 32.8 g/dL (ref 30.0–36.0)
MCV: 92.7 fl (ref 78.0–100.0)
MONOS PCT: 9.1 % (ref 3.0–12.0)
Monocytes Absolute: 0.7 10*3/uL (ref 0.1–1.0)
NEUTROS ABS: 5.5 10*3/uL (ref 1.4–7.7)
NEUTROS PCT: 76.8 % (ref 43.0–77.0)
PLATELETS: 353 10*3/uL (ref 150.0–400.0)
RBC: 4.64 Mil/uL (ref 3.87–5.11)
RDW: 13.9 % (ref 11.5–15.5)
WBC: 7.1 10*3/uL (ref 4.0–10.5)

## 2017-09-25 LAB — C-REACTIVE PROTEIN: CRP: 11.8 mg/dL (ref 0.5–20.0)

## 2017-09-25 NOTE — Progress Notes (Signed)
Nancy Carr - 71 y.o. female MRN [id number]  Date of birth: Jul 15, 1946  SUBJECTIVE:  Including CC & ROS.  Chief Complaint  Patient presents with  . Abdominal Pain    Nancy Carr is a 71 y.o. female that is here today for an evaluation of abdominal pain. Pain is located lower abdominal quadrant. Pain started last week. Admits to constipation which improved with prunes. Denies injury. Reports to running a fever at night. Her bowel movements have not been diarrhea but have been loose. No blood in her stools. No new foods.   Review of the colonoscopy from 2018 shows diverticulosis of the left colon.    Review of Systems  Constitutional: Negative for fever.  Respiratory: Negative for shortness of breath.   Gastrointestinal: Positive for abdominal pain, constipation and diarrhea. Negative for abdominal distention.  Musculoskeletal: Negative for gait problem.  Skin: Negative for color change.  Neurological: Negative for weakness.  Hematological: Negative for adenopathy.  Psychiatric/Behavioral: Negative for agitation.    HISTORY: Past Medical, Surgical, Social, and Family History Reviewed & Updated per EMR.   Pertinent Historical Findings include:  Past Medical History:  Diagnosis Date  . GERD (gastroesophageal reflux disease)    mild  . Hx of colonic polyps 2007   villous adenoma   . Osteoporosis   . Rosacea     Past Surgical History:  Procedure Laterality Date  . COLONOSCOPY    . COLONOSCOPY W/ POLYPECTOMY    . HYSTEROSCOPY    . POLYPECTOMY  2007   done by surgeon, Dr Dalbert Batman  . RECTAL POLYPECTOMY    . WISDOM TOOTH EXTRACTION      No Known Allergies  Family History  Problem Relation Age of Onset  . Uterine cancer Sister        clear cell  . Melanoma Sister   . Hypertension Sister   . Asthma Sister   . Coronary artery disease Father        also Alsheimer's; Parkinson's  . Coronary artery disease Paternal Grandfather        also CVA  . Heart attack  Maternal Grandfather        < 55  . Stroke Paternal Grandmother        > 65; also Alsheimer's  . Dementia Mother        also Osteoporosis  . Irritable bowel syndrome Mother   . Heart attack Maternal Grandmother        < 65;also Alsheimer's  . Dementia Maternal Uncle   . Colon cancer Neg Hx   . Stomach cancer Neg Hx   . Esophageal cancer Neg Hx   . Diabetes Neg Hx      Social History   Socioeconomic History  . Marital status: Widowed    Spouse name: Not on file  . Number of children: 2  . Years of education: Not on file  . Highest education level: Not on file  Social Needs  . Financial resource strain: Not on file  . Food insecurity - worry: Not on file  . Food insecurity - inability: Not on file  . Transportation needs - medical: Not on file  . Transportation needs - non-medical: Not on file  Occupational History  . Occupation: Retired  Tobacco Use  . Smoking status: Never Smoker  . Smokeless tobacco: Never Used  Substance and Sexual Activity  . Alcohol use: Yes    Alcohol/week: 4.2 oz    Types: 7 Glasses of wine per week  .  Drug use: No  . Sexual activity: Not on file  Other Topics Concern  . Not on file  Social History Narrative  . Not on file     PHYSICAL EXAM:  VS: BP 128/72 (BP Location: Left Arm, Patient Position: Sitting, Cuff Size: Normal)   Pulse (!) 104   Temp 99.8 F (37.7 C) (Oral)   Ht 5\' 1"  (1.549 m)   Wt 154 lb (69.9 kg)   SpO2 100%   BMI 29.10 kg/m  Physical Exam Gen: NAD, alert, cooperative with exam, well-appearing ENT: normal lips, normal nasal mucosa,  Eye: normal EOM, normal conjunctiva and lids CV:  no edema, +2 pedal pulses   Resp: no accessory muscle use, non-labored,  GI: no masses , mild tenderness in the lower quadrants, no hernia, soft, +BS  Skin: no rashes, no areas of induration  Neuro: normal tone, normal sensation to touch Psych:  normal insight, alert and oriented MSK: normal gait, normal strength        ASSESSMENT & PLAN:   Generalized abdominal pain Pain is possible to be related to constipation vs diverticulitis.  - CRP and CBC. If suggestive of an infection then will send ABX  - advised if a constipation picture, counseled on treatment. Could consider an abdominal xray.

## 2017-09-25 NOTE — Patient Instructions (Signed)
We will call you with the results from today.  Please try using miralax

## 2017-09-26 ENCOUNTER — Telehealth: Payer: Self-pay | Admitting: Family Medicine

## 2017-09-26 DIAGNOSIS — R1084 Generalized abdominal pain: Secondary | ICD-10-CM | POA: Insufficient documentation

## 2017-09-26 NOTE — Telephone Encounter (Signed)
Patient called in per provider request from visit on 09/25/17, patient reports not feeling any better, she took Dulcolax 2 tabs yesterday around 4pm and took Miralax this morning. She reported having a "tiny" bowel movement, checked temperature orally and it was 99.0 while on the phone, reported taking advil around 1:30-2pm today, stated "I feel like I have a bowel obstruction." Reports having no appetite, however ate some oatmeal for breakfast, would like to know if it is safe for her to drive to [facility] tomorrow or stay home, if antibiotic prescription is needed, requested to send to [facility] on [address] in [place], [place].

## 2017-09-26 NOTE — Assessment & Plan Note (Signed)
Pain is possible to be related to constipation vs diverticulitis.  - CRP and CBC. If suggestive of an infection then will send ABX  - advised if a constipation picture, counseled on treatment. Could consider an abdominal xray.

## 2017-09-26 NOTE — Telephone Encounter (Signed)
Left VM for patient. If she calls back please have her speak with a nurse/CMA and ask how she is feeling. Is still having mild fever then could send in ABX to cover for diverticulitis since she is leaving town.   If any questions then please take the best time and phone number to call and I will try to call her back.   Rosemarie Ax, MD [facility] 09/26/2017, 11:19 AM

## 2017-09-27 ENCOUNTER — Ambulatory Visit: Payer: Self-pay

## 2017-09-27 ENCOUNTER — Telehealth: Payer: Self-pay | Admitting: Internal Medicine

## 2017-09-27 ENCOUNTER — Other Ambulatory Visit: Payer: Self-pay

## 2017-09-27 ENCOUNTER — Encounter (HOSPITAL_COMMUNITY): Admission: EM | Disposition: A | Discharge status: Home / Self Care | Payer: Self-pay | Source: Home / Self Care

## 2017-09-27 ENCOUNTER — Inpatient Hospital Stay (HOSPITAL_COMMUNITY)
Admission: EM | Admit: 2017-09-27 | Discharge: 2017-09-30 | DRG: 340 | Disposition: A | Discharge status: Home / Self Care | Payer: Medicare Other | Attending: Surgery | Admitting: Surgery

## 2017-09-27 ENCOUNTER — Emergency Department (HOSPITAL_COMMUNITY): Payer: Medicare Other | Admitting: Certified Registered Nurse Anesthetist

## 2017-09-27 ENCOUNTER — Encounter (HOSPITAL_COMMUNITY): Payer: Self-pay | Admitting: Emergency Medicine

## 2017-09-27 ENCOUNTER — Emergency Department (HOSPITAL_COMMUNITY): Payer: Medicare Other

## 2017-09-27 DIAGNOSIS — Z79899 Other long term (current) drug therapy: Secondary | ICD-10-CM

## 2017-09-27 DIAGNOSIS — R3129 Other microscopic hematuria: Secondary | ICD-10-CM | POA: Diagnosis not present

## 2017-09-27 DIAGNOSIS — K3533 Acute appendicitis with perforation and localized peritonitis, with abscess: Secondary | ICD-10-CM | POA: Diagnosis not present

## 2017-09-27 DIAGNOSIS — R739 Hyperglycemia, unspecified: Secondary | ICD-10-CM | POA: Diagnosis not present

## 2017-09-27 DIAGNOSIS — E7800 Pure hypercholesterolemia, unspecified: Secondary | ICD-10-CM | POA: Diagnosis not present

## 2017-09-27 DIAGNOSIS — M810 Age-related osteoporosis without current pathological fracture: Secondary | ICD-10-CM | POA: Diagnosis not present

## 2017-09-27 DIAGNOSIS — K3580 Unspecified acute appendicitis: Secondary | ICD-10-CM | POA: Diagnosis not present

## 2017-09-27 DIAGNOSIS — K2270 Barrett's esophagus without dysplasia: Secondary | ICD-10-CM | POA: Diagnosis not present

## 2017-09-27 HISTORY — PX: LAPAROSCOPIC APPENDECTOMY: SHX408

## 2017-09-27 LAB — COMPREHENSIVE METABOLIC PANEL
ALBUMIN: 3.6 g/dL (ref 3.5–5.0)
ALT: 24 U/L (ref 14–54)
ANION GAP: 9 (ref 5–15)
AST: 24 U/L (ref 15–41)
Alkaline Phosphatase: 63 U/L (ref 38–126)
BUN: 12 mg/dL (ref 6–20)
CALCIUM: 8.9 mg/dL (ref 8.9–10.3)
CHLORIDE: 104 mmol/L (ref 101–111)
CO2: 25 mmol/L (ref 22–32)
Creatinine, Ser: 0.8 mg/dL (ref 0.44–1.00)
GFR calc Af Amer: >60 mL/min (ref >60)
GFR calc non Af Amer: >60 mL/min (ref >60)
GLUCOSE: 109 mg/dL — AB (ref 65–99)
Potassium: 4 mmol/L (ref 3.5–5.1)
SODIUM: 138 mmol/L (ref 135–145)
Total Bilirubin: 0.9 mg/dL (ref 0.3–1.2)
Total Protein: 7.5 g/dL (ref 6.5–8.1)

## 2017-09-27 LAB — CBC
HEMATOCRIT: 39.4 % (ref 36.0–46.0)
HEMOGLOBIN: 12.9 g/dL (ref 12.0–15.0)
MCH: 30.2 pg (ref 26.0–34.0)
MCHC: 32.7 g/dL (ref 30.0–36.0)
MCV: 92.3 fL (ref 78.0–100.0)
Platelets: 314 10*3/uL (ref 150–400)
RBC: 4.27 MIL/uL (ref 3.87–5.11)
RDW: 13.5 % (ref 11.5–15.5)
WBC: 8.4 10*3/uL (ref 4.0–10.5)

## 2017-09-27 LAB — URINALYSIS, ROUTINE W REFLEX MICROSCOPIC
BACTERIA UA: NONE SEEN
Bilirubin Urine: NEGATIVE
Glucose, UA: NEGATIVE mg/dL
Ketones, ur: NEGATIVE mg/dL
Leukocytes, UA: NEGATIVE
Nitrite: NEGATIVE
Protein, ur: 100 mg/dL — AB
Specific Gravity, Urine: 1.011 (ref 1.005–1.030)
pH: 6 (ref 5.0–8.0)

## 2017-09-27 LAB — POC OCCULT BLOOD, ED: FECAL OCCULT BLD: NEGATIVE

## 2017-09-27 LAB — LIPASE, BLOOD: Lipase: 20 U/L (ref 11–51)

## 2017-09-27 SURGERY — APPENDECTOMY, LAPAROSCOPIC
Anesthesia: General | Site: Abdomen

## 2017-09-27 MED ORDER — SUGAMMADEX SODIUM 200 MG/2ML IV SOLN
INTRAVENOUS | Status: AC
  Filled 2017-09-27: qty 2

## 2017-09-27 MED ORDER — FENTANYL CITRATE (PF) 100 MCG/2ML IJ SOLN
INTRAMUSCULAR | Status: AC
  Filled 2017-09-27: qty 2

## 2017-09-27 MED ORDER — SENNA 8.6 MG PO TABS
1.0000 | ORAL_TABLET | Freq: Two times a day (BID) | ORAL | Status: DC
  Administered 2017-09-28 – 2017-09-29 (×3): 8.6 mg via ORAL
  Filled 2017-09-27 (×5): qty 1

## 2017-09-27 MED ORDER — IOPAMIDOL (ISOVUE-300) INJECTION 61%
INTRAVENOUS | Status: AC
  Administered 2017-09-27: 100 mL via INTRAVENOUS
  Filled 2017-09-27: qty 100

## 2017-09-27 MED ORDER — LIDOCAINE 2% (20 MG/ML) 5 ML SYRINGE
INTRAMUSCULAR | Status: AC
  Filled 2017-09-27: qty 5

## 2017-09-27 MED ORDER — LIDOCAINE 2% (20 MG/ML) 5 ML SYRINGE
INTRAMUSCULAR | Status: DC | PRN
  Administered 2017-09-27: 60 mg via INTRAVENOUS

## 2017-09-27 MED ORDER — SUCCINYLCHOLINE CHLORIDE 200 MG/10ML IV SOSY
PREFILLED_SYRINGE | INTRAVENOUS | Status: AC
  Filled 2017-09-27: qty 10

## 2017-09-27 MED ORDER — SUCCINYLCHOLINE CHLORIDE 200 MG/10ML IV SOSY
PREFILLED_SYRINGE | INTRAVENOUS | Status: DC | PRN
  Administered 2017-09-27: 100 mg via INTRAVENOUS

## 2017-09-27 MED ORDER — DEXAMETHASONE SODIUM PHOSPHATE 10 MG/ML IJ SOLN
INTRAMUSCULAR | Status: AC
  Filled 2017-09-27: qty 1

## 2017-09-27 MED ORDER — SUGAMMADEX SODIUM 200 MG/2ML IV SOLN
INTRAVENOUS | Status: DC | PRN
  Administered 2017-09-27: 150 mg via INTRAVENOUS

## 2017-09-27 MED ORDER — BUPIVACAINE-EPINEPHRINE 0.25% -1:200000 IJ SOLN
INTRAMUSCULAR | Status: AC
  Filled 2017-09-27: qty 1

## 2017-09-27 MED ORDER — IBUPROFEN 200 MG PO TABS: 400.0000 mg | ORAL_TABLET | Freq: Every day | ORAL | Status: DC | PRN

## 2017-09-27 MED ORDER — ACETAMINOPHEN 650 MG RE SUPP
650.0000 mg | Freq: Once | RECTAL | Status: AC
  Administered 2017-09-27: 650 mg via RECTAL
  Filled 2017-09-27: qty 1

## 2017-09-27 MED ORDER — PANTOPRAZOLE SODIUM 40 MG IV SOLR
40.0000 mg | Freq: Every day | INTRAVENOUS | Status: DC
  Administered 2017-09-27 – 2017-09-28 (×2): 40 mg via INTRAVENOUS
  Filled 2017-09-27 (×2): qty 40

## 2017-09-27 MED ORDER — MORPHINE SULFATE (PF) 2 MG/ML IV SOLN
1.0000 mg | INTRAVENOUS | Status: DC | PRN
  Administered 2017-09-27 – 2017-09-28 (×3): 1 mg via INTRAVENOUS
  Administered 2017-09-28: 2 mg via INTRAVENOUS
  Filled 2017-09-27 (×4): qty 1

## 2017-09-27 MED ORDER — TRAMADOL HCL 50 MG PO TABS
50.0000 mg | ORAL_TABLET | Freq: Four times a day (QID) | ORAL | Status: DC | PRN
  Administered 2017-09-29 – 2017-09-30 (×2): 50 mg via ORAL
  Filled 2017-09-27 (×2): qty 1

## 2017-09-27 MED ORDER — ZOLPIDEM TARTRATE 5 MG PO TABS: 5.0000 mg | ORAL_TABLET | Freq: Every evening | ORAL | Status: DC | PRN

## 2017-09-27 MED ORDER — ROCURONIUM BROMIDE 50 MG/5ML IV SOSY
PREFILLED_SYRINGE | INTRAVENOUS | Status: DC | PRN
  Administered 2017-09-27: 30 mg via INTRAVENOUS

## 2017-09-27 MED ORDER — PIPERACILLIN-TAZOBACTAM 3.375 G IVPB 30 MIN
3.3750 g | Freq: Once | INTRAVENOUS | Status: AC
  Administered 2017-09-27: 3.375 g via INTRAVENOUS
  Filled 2017-09-27: qty 50

## 2017-09-27 MED ORDER — DIPHENHYDRAMINE HCL 12.5 MG/5ML PO ELIX: 12.5000 mg | ORAL_SOLUTION | Freq: Four times a day (QID) | ORAL | Status: DC | PRN

## 2017-09-27 MED ORDER — GABAPENTIN 300 MG PO CAPS
300.0000 mg | ORAL_CAPSULE | Freq: Two times a day (BID) | ORAL | Status: DC
  Administered 2017-09-28 – 2017-09-30 (×5): 300 mg via ORAL
  Filled 2017-09-27 (×5): qty 1

## 2017-09-27 MED ORDER — DEXAMETHASONE SODIUM PHOSPHATE 10 MG/ML IJ SOLN
INTRAMUSCULAR | Status: DC | PRN
  Administered 2017-09-27: 10 mg via INTRAVENOUS

## 2017-09-27 MED ORDER — MORPHINE SULFATE (PF) 2 MG/ML IV SOLN
2.0000 mg | Freq: Once | INTRAVENOUS | Status: AC
  Administered 2017-09-27: 2 mg via INTRAVENOUS
  Filled 2017-09-27: qty 1

## 2017-09-27 MED ORDER — LIDOCAINE HCL (PF) 1 % IJ SOLN
INTRAMUSCULAR | Status: AC
  Filled 2017-09-27: qty 30

## 2017-09-27 MED ORDER — LIDOCAINE HCL (PF) 1 % IJ SOLN
INTRAMUSCULAR | Status: DC | PRN
  Administered 2017-09-27: 10 mL

## 2017-09-27 MED ORDER — ONDANSETRON HCL 4 MG/2ML IJ SOLN
INTRAMUSCULAR | Status: AC
  Filled 2017-09-27: qty 2

## 2017-09-27 MED ORDER — FENTANYL CITRATE (PF) 100 MCG/2ML IJ SOLN
25.0000 ug | INTRAMUSCULAR | Status: DC | PRN
  Administered 2017-09-27 (×4): 25 ug via INTRAVENOUS

## 2017-09-27 MED ORDER — ENOXAPARIN SODIUM 40 MG/0.4ML ~~LOC~~ SOLN
40.0000 mg | SUBCUTANEOUS | Status: DC
  Administered 2017-09-28 – 2017-09-30 (×3): 40 mg via SUBCUTANEOUS
  Filled 2017-09-27 (×4): qty 0.4

## 2017-09-27 MED ORDER — BUPIVACAINE-EPINEPHRINE 0.25% -1:200000 IJ SOLN
INTRAMUSCULAR | Status: DC | PRN
  Administered 2017-09-27: 10 mL

## 2017-09-27 MED ORDER — ONDANSETRON HCL 4 MG/2ML IJ SOLN
INTRAMUSCULAR | Status: DC | PRN
  Administered 2017-09-27: 4 mg via INTRAVENOUS

## 2017-09-27 MED ORDER — ONDANSETRON HCL 4 MG/2ML IJ SOLN
4.0000 mg | Freq: Four times a day (QID) | INTRAMUSCULAR | Status: DC | PRN
  Administered 2017-09-27: 4 mg via INTRAVENOUS
  Filled 2017-09-27: qty 2

## 2017-09-27 MED ORDER — PROPOFOL 10 MG/ML IV BOLUS
INTRAVENOUS | Status: AC
  Filled 2017-09-27: qty 20

## 2017-09-27 MED ORDER — ACETAMINOPHEN 325 MG PO TABS
650.0000 mg | ORAL_TABLET | Freq: Four times a day (QID) | ORAL | Status: DC | PRN
  Administered 2017-09-28 – 2017-09-29 (×2): 650 mg via ORAL
  Filled 2017-09-27 (×2): qty 2

## 2017-09-27 MED ORDER — LACTATED RINGERS IV SOLN
INTRAVENOUS | Status: DC | PRN
  Administered 2017-09-27 (×2): via INTRAVENOUS

## 2017-09-27 MED ORDER — PHENYLEPHRINE 40 MCG/ML (10ML) SYRINGE FOR IV PUSH (FOR BLOOD PRESSURE SUPPORT)
PREFILLED_SYRINGE | INTRAVENOUS | Status: DC | PRN
  Administered 2017-09-27 (×2): 40 ug via INTRAVENOUS

## 2017-09-27 MED ORDER — ACETAMINOPHEN 650 MG RE SUPP: 650.0000 mg | Freq: Four times a day (QID) | RECTAL | Status: DC | PRN

## 2017-09-27 MED ORDER — LACTATED RINGERS IV SOLN
INTRAVENOUS | Status: DC
  Administered 2017-09-27: 1000 mL via INTRAVENOUS
  Administered 2017-09-28 – 2017-09-29 (×3): via INTRAVENOUS

## 2017-09-27 MED ORDER — MORPHINE SULFATE (PF) 2 MG/ML IV SOLN
1.0000 mg | Freq: Once | INTRAVENOUS | Status: AC
  Administered 2017-09-27: 1 mg via INTRAVENOUS
  Filled 2017-09-27: qty 1

## 2017-09-27 MED ORDER — ROCURONIUM BROMIDE 50 MG/5ML IV SOSY
PREFILLED_SYRINGE | INTRAVENOUS | Status: AC
  Filled 2017-09-27: qty 5

## 2017-09-27 MED ORDER — ONDANSETRON 4 MG PO TBDP: 4.0000 mg | ORAL_TABLET | Freq: Four times a day (QID) | ORAL | Status: DC | PRN

## 2017-09-27 MED ORDER — PIPERACILLIN-TAZOBACTAM 3.375 G IVPB
3.3750 g | Freq: Three times a day (TID) | INTRAVENOUS | Status: DC
  Administered 2017-09-27 – 2017-09-30 (×8): 3.375 g via INTRAVENOUS
  Filled 2017-09-27 (×8): qty 50

## 2017-09-27 MED ORDER — PROPOFOL 10 MG/ML IV BOLUS
INTRAVENOUS | Status: DC | PRN
  Administered 2017-09-27: 60 mg via INTRAVENOUS
  Administered 2017-09-27: 100 mg via INTRAVENOUS

## 2017-09-27 MED ORDER — SODIUM CHLORIDE 0.9 % IV BOLUS (SEPSIS)
1000.0000 mL | Freq: Once | INTRAVENOUS | Status: AC
  Administered 2017-09-27: 1000 mL via INTRAVENOUS

## 2017-09-27 MED ORDER — FENTANYL CITRATE (PF) 100 MCG/2ML IJ SOLN
INTRAMUSCULAR | Status: DC | PRN
  Administered 2017-09-27 (×4): 50 ug via INTRAVENOUS

## 2017-09-27 MED ORDER — ARTIFICIAL TEARS OPHTHALMIC OINT
TOPICAL_OINTMENT | OPHTHALMIC | Status: DC | PRN
  Administered 2017-09-27: 1 via OPHTHALMIC

## 2017-09-27 MED ORDER — PHENYLEPHRINE 40 MCG/ML (10ML) SYRINGE FOR IV PUSH (FOR BLOOD PRESSURE SUPPORT)
PREFILLED_SYRINGE | INTRAVENOUS | Status: AC
  Filled 2017-09-27: qty 10

## 2017-09-27 MED ORDER — DIPHENHYDRAMINE HCL 50 MG/ML IJ SOLN: 12.5000 mg | Freq: Four times a day (QID) | INTRAMUSCULAR | Status: DC | PRN

## 2017-09-27 MED ORDER — OXYCODONE HCL 5 MG PO TABS: 5.0000 mg | ORAL_TABLET | ORAL | Status: DC | PRN

## 2017-09-27 MED ORDER — IOPAMIDOL (ISOVUE-300) INJECTION 61%
100.0000 mL | Freq: Once | INTRAVENOUS | Status: AC | PRN
  Administered 2017-09-27: 100 mL via INTRAVENOUS

## 2017-09-27 SURGICAL SUPPLY — 38 items
ADH SKN CLS APL DERMABOND .7 (GAUZE/BANDAGES/DRESSINGS) ×1
APPLIER CLIP ROT 10 11.4 M/L (STAPLE)
APR CLP MED LRG 11.4X10 (STAPLE)
BAG SPEC RTRVL LRG 6X4 10 (ENDOMECHANICALS) ×1
CABLE HIGH FREQUENCY MONO STRZ (ELECTRODE) ×3 IMPLANT
CLIP APPLIE ROT 10 11.4 M/L (STAPLE) IMPLANT
COVER SURGICAL LIGHT HANDLE (MISCELLANEOUS) ×3 IMPLANT
CUTTER FLEX LINEAR 45M (STAPLE) ×2 IMPLANT
DECANTER SPIKE VIAL GLASS SM (MISCELLANEOUS) ×3 IMPLANT
DERMABOND ADVANCED (GAUZE/BANDAGES/DRESSINGS) ×2
DERMABOND ADVANCED .7 DNX12 (GAUZE/BANDAGES/DRESSINGS) ×1 IMPLANT
DRAPE LAPAROSCOPIC ABDOMINAL (DRAPES) ×1 IMPLANT
ELECT REM PT RETURN 15FT ADLT (MISCELLANEOUS) ×3 IMPLANT
ENDOLOOP SUT PDS II  0 18 (SUTURE)
ENDOLOOP SUT PDS II 0 18 (SUTURE) IMPLANT
GLOVE BIO SURGEON STRL SZ 6 (GLOVE) ×5 IMPLANT
GLOVE INDICATOR 6.5 STRL GRN (GLOVE) ×3 IMPLANT
GOWN STRL REUS W/TWL 2XL LVL3 (GOWN DISPOSABLE) ×3 IMPLANT
GOWN STRL REUS W/TWL XL LVL3 (GOWN DISPOSABLE) ×3 IMPLANT
KIT BASIN OR (CUSTOM PROCEDURE TRAY) ×3 IMPLANT
POUCH SPECIMEN RETRIEVAL 10MM (ENDOMECHANICALS) ×3 IMPLANT
RELOAD 45 VASCULAR/THIN (ENDOMECHANICALS) IMPLANT
RELOAD STAPLE 45 2.5 WHT GRN (ENDOMECHANICALS) IMPLANT
RELOAD STAPLE 45 3.5 BLU ETS (ENDOMECHANICALS) IMPLANT
RELOAD STAPLE TA45 3.5 REG BLU (ENDOMECHANICALS) ×6 IMPLANT
SCISSORS LAP 5X35 DISP (ENDOMECHANICALS) ×1 IMPLANT
SET IRRIG TUBING LAPAROSCOPIC (IRRIGATION / IRRIGATOR) ×3 IMPLANT
SHEARS HARMONIC ACE PLUS 36CM (ENDOMECHANICALS) ×3 IMPLANT
SLEEVE XCEL OPT CAN 5 100 (ENDOMECHANICALS) ×3 IMPLANT
SUT MNCRL AB 4-0 PS2 18 (SUTURE) ×3 IMPLANT
SUT VICRYL 0 UR6 27IN ABS (SUTURE) ×2 IMPLANT
TOWEL OR 17X26 10 PK STRL BLUE (TOWEL DISPOSABLE) ×3 IMPLANT
TRAY FOLEY CATH 14FRSI W/METER (CATHETERS) ×2 IMPLANT
TRAY FOLEY W/METER SILVER 16FR (SET/KITS/TRAYS/PACK) IMPLANT
TRAY LAPAROSCOPIC (CUSTOM PROCEDURE TRAY) ×3 IMPLANT
TROCAR BLADELESS OPT 5 100 (ENDOMECHANICALS) ×3 IMPLANT
TROCAR XCEL BLUNT TIP 100MML (ENDOMECHANICALS) ×3 IMPLANT
TUBING INSUF HEATED (TUBING) ×3 IMPLANT

## 2017-09-27 NOTE — Op Note (Signed)
Appendectomy, Lap, Procedure Note  Indications: The patient presented with a history of right-sided abdominal pain. A CT revealed findings consistent with acute appendicitis and possible abscess.  Pre-operative Diagnosis: Acute ruptured appendicitis with abscess  Post-operative Diagnosis: Same  Surgeon: Stark Klein   Assistants: n/a  Anesthesia: General endotracheal anesthesia and Local anesthesia 1% plain lidocaine, 0.25.% bupivacaine, with epinephrine  ASA Class: 2, emergent  Procedure Details  The patient was seen again in the Holding Room. The risks, benefits, complications, treatment options, and expected outcomes were discussed with the patient and/or family. The possibilities of perforation of viscus, bleeding, recurrent infection, the need for additional procedures, failure to diagnose a condition, and creating a complication requiring transfusion or operation were discussed. There was concurrence with the proposed plan and informed consent was obtained. The site of surgery was properly noted. The patient was taken to Operating Room, identified as Nancy Carr and the procedure verified as Appendectomy. A Time Out was held and the above information confirmed.  The patient was placed in the supine position and general anesthesia was induced, along with placement of orogastric tube, Venodyne boots, and a Foley catheter. The abdomen was prepped and draped in a sterile fashion. Local anesthetic was infiltrated in the infraumbilical region.  A 1.5 cm curvilinear transverse incision was made just below the umbilicus.  The Kelly clamp was used to spread the subcutaneous tissues.  The fascia was elevated with 2 Kocher clamps and incised with the #11 blade.  A Claiborne Billings was used to confirm entrance into the peritoneal cavity.  A pursestring suture was placed around the fascial incision.  The Hasson trocar was inserted into the abdomen and held in place with the tails of the suture.  The  pneumoperitoneum was then established to steady pressure of 15 mmHg.     Additional 5 mm cannulas then placed in the left lower quadrant of the abdomen and the suprapubic region under direct visualization.  A careful evaluation of the entire abdomen was carried out. The patient was placed in Trendelenburg and rotated to the left.  The small intestines were retracted in the cephalad and left lateral direction away from the pelvis and right lower quadrant. The patient was found to have small bowel adherent to the appendix.  This was pulled off gently and an abscess cavity was opened.  This was aspirated and suctioned.  There was some bleeding from the mesentery in the abscess cavity that was controlled with the harmonic.  The appendix was perforated in this region.  The proximal appendix was skeletonized with the harmonic scalpel.  There was a clean base at the cecum.  It was not clear that the entire tip was removed, but this portion was in the site of the bleeding, so it was left alone.  There was no residual purulent material or fecalith present.  The appendix was divided at its base using an endo-GIA stapler. Minimal appendiceal stump was left in place. The appendix was removed from the abdomen with an Endocatch bag through the left subcostal port.  There was no evidence of bleeding, leakage, or complication after division of the appendix. Irrigation was also performed and irrigate suctioned from the abdomen as well.  The 5 mm trocars were removed.  The pneumoperitoneum was evacuated from the abdomen.    The trocar site skin wounds were closed with 4-0 Monocryl and dressed with Dermabond.  Instrument, sponge, and needle counts were correct at the conclusion of the case.   Findings: The  appendix was found to be inflamed. There were signs of necrosis.  There was perforation. There was abscess formation.    Estimated Blood Loss:  less than 50 mL                Specimens: appendix to pathology          Complications:  None; patient tolerated the procedure well.         Disposition: PACU - hemodynamically stable.         Condition: stable

## 2017-09-27 NOTE — Telephone Encounter (Signed)
Call from pt. to report severe constipation since 12/11;  After increasing water intake and prune juice with no improvement, was seen in the office on 12/18, and advised to take Miralax.  Reported taking Dulcolax on Tues, Miralax on Wed., Fleet enema on Wed, and again this morning.  Reported poor results from enema; "watery stool with flecks."  Reported blood on toilet tissue this AM; stated "I feel it is my hemorrhoids."  Stated has low grade temp; 100 degrees at 10:00 AM today.  C/o constant abdominal discomfort, nausea, and abdominal distension; no vomiting, but stated she feels like she could vomit.  Per protocol, advised to go to the ER.  Encouraged to have someone take her to the ER, if possible.  Verb. Understanding.         Reason for Disposition . [1] Constant abdominal pain AND [2] present > 2 hours  Answer Assessment - Initial Assessment Questions 1. STOOL PATTERN OR FREQUENCY: "How often do you pass bowel movements (BMs)?"  (Normal range: tid to q 3 days)  "When was the last BM passed?"       Usually would go everyday; thinks last BM was 12/9 or 12/10 2. STRAINING: "Do you have to strain to have a BM?"      Yes in attempting to have BM 3. RECTAL PAIN: "Does your rectum hurt when the stool comes out?" If so, ask: "Do you have hemorrhoids? How bad is the pain?"  (Scale 1-10; or mild, moderate, severe)    Denies rectal pain 4. STOOL COMPOSITION: "Are the stools hard?"      Took 2 enemas today; passed loos stool and  5. BLOOD ON STOOLS: "Has there been any blood on the toilet tissue or on the surface of the BM?" If so, ask: "When was the last time?"      Blood on toilet tissue  6. CHRONIC CONSTIPATION: "Is this a new problem for you?"  If no, ask: How long have you had this problem?" (days, weeks, months)      About 10 days  7. CHANGES IN DIET: "Have there been any recent changes in your diet?"      Not that pt. Is aware of; has attended a lot of luncheons and dinners for holiday  celebrations.   8. MEDICATIONS: "Have you been taking any new medications?"     No 9. LAXATIVES: "Have you been using any laxatives or enemas?"  If yes, ask "What, how often, and when was the last time?"     Took Dulcolax 2 tabs, Miralax on Wed., Fleets Enema on Wed. PM, and Thurs. AM 10. CAUSE: "What do you think is causing the constipation?"       Unsure 11. OTHER SYMPTOMS: "Do you have any other symptoms?" (e.g., abdominal pain, fever, vomiting)       Temp. 100 degrees at 10:00 AM today; abdominal pain across abdomen; abdominal bloating; c/o nausea this AM; hasn't vomited; passing gas.   12. PREGNANCY: "Is there any chance you are pregnant?" "When was your last menstrual period?"      No  Protocols used: CONSTIPATION-A-AH

## 2017-09-27 NOTE — Anesthesia Preprocedure Evaluation (Signed)
Anesthesia Evaluation  Patient identified by MRN, date of birth, ID band Patient awake    Reviewed: Allergy & Precautions, H&P , Patient's Chart, lab work & pertinent test results, reviewed documented beta blocker date and time   Airway Mallampati: II  TM Distance: >3 FB Neck ROM: full    Dental no notable dental hx.    Pulmonary    Pulmonary exam normal breath sounds clear to auscultation       Cardiovascular  Rhythm:regular Rate:Normal     Neuro/Psych    GI/Hepatic   Endo/Other    Renal/GU      Musculoskeletal   Abdominal   Peds  Hematology   Anesthesia Other Findings   Reproductive/Obstetrics                             Anesthesia Physical Anesthesia Plan  ASA: II and emergent  Anesthesia Plan: General   Post-op Pain Management:    Induction: Intravenous, Rapid sequence and Cricoid pressure planned  PONV Risk Score and Plan: 2 and Treatment may vary due to age or medical condition, Dexamethasone and Ondansetron  Airway Management Planned: Oral ETT  Additional Equipment:   Intra-op Plan:   Post-operative Plan: Extubation in OR  Informed Consent: I have reviewed the patients History and Physical, chart, labs and discussed the procedure including the risks, benefits and alternatives for the proposed anesthesia with the patient or authorized representative who has indicated his/her understanding and acceptance.   Dental Advisory Given  Plan Discussed with: CRNA and Surgeon  Anesthesia Plan Comments: (  )        Anesthesia Quick Evaluation

## 2017-09-27 NOTE — Transfer of Care (Signed)
Immediate Anesthesia Transfer of Care Note  Patient: Nancy Carr  Procedure(s) Performed: APPENDECTOMY LAPAROSCOPIC (N/A Abdomen)  Patient Location: PACU  Anesthesia Type:General  Level of Consciousness: drowsy and patient cooperative  Airway & Oxygen Therapy: Patient Spontanous Breathing and Patient connected to face mask oxygen  Post-op Assessment: Report given to RN and Post -op Vital signs reviewed and stable  Post vital signs: Reviewed and stable  Last Vitals:  Vitals:   09/27/17 1645 09/27/17 1800  BP: 101/89 100/67  Pulse: 99 86  Resp:  18  Temp:    SpO2: 98% 93%    Last Pain:  Vitals:   09/27/17 1521  TempSrc:   PainSc: 3          Complications: No apparent anesthesia complications

## 2017-09-27 NOTE — Telephone Encounter (Signed)
Left VM for patient. If she calls back please have her speak with a nurse/CMA and ask if her pain has improved. Ask if she stayed home or went on her trip. I can send medications wherever she is.    If any questions then please take the best time and phone number to call and I will try to call her back.   Rosemarie Ax, MD [facility] 09/27/2017, 5:19 PM

## 2017-09-27 NOTE — Anesthesia Procedure Notes (Signed)
Procedure Name: Intubation Date/Time: 09/27/2017 7:54 PM Performed by: Montel Clock, CRNA Pre-anesthesia Checklist: Patient identified, Emergency Drugs available, Suction available, Patient being monitored and Timeout performed Patient Re-evaluated:Patient Re-evaluated prior to induction Oxygen Delivery Method: Circle system utilized Preoxygenation: Pre-oxygenation with 100% oxygen Induction Type: IV induction, Rapid sequence and Cricoid Pressure applied Laryngoscope Size: 3 and Glidescope Grade View: Grade II Tube type: Oral Tube size: 7.0 mm Number of attempts: 2 Airway Equipment and Method: Rigid stylet and Video-laryngoscopy Placement Confirmation: ETT inserted through vocal cords under direct vision,  positive ETCO2 and breath sounds checked- equal and bilateral Secured at: 20 cm Tube secured with: Tape Dental Injury: Teeth and Oropharynx as per pre-operative assessment  Difficulty Due To: Difficult Airway- due to anterior larynx Comments: First attempt with MAC 3, able to visualize arytenoids, unable to pass ETT. Second attempt, glidescope, ETT easily passed through cords.

## 2017-09-27 NOTE — ED Provider Notes (Signed)
[facility] DEPT Provider Note   CSN: [id number] Arrival date & time: 09/27/17  1203     History   Chief Complaint Chief Complaint  Patient presents with  . Constipation    HPI Nancy Carr is a 70 y.o. female.  HPI planes of lower crampy abdominal pain onset 10 days ago pain is constant, she feels constipated other associated symptoms include nausea, no vomiting.  She is been treated with laxatives and with 2 enemas, without relief.  No urinary symptoms no fever.  No other associated symptoms.  Nothing makes symptoms better or worse.  She did have slight bowel movement this morning  Past Medical History:  Diagnosis Date  . GERD (gastroesophageal reflux disease)    mild  . Hx of colonic polyps 2007   villous adenoma   . Osteoporosis   . Rosacea     Patient Active Problem List   Diagnosis Date Noted  . Generalized abdominal pain 09/26/2017  . Prediabetes 05/05/2017  . Elevated cholesterol 03/16/2017  . Hyperglycemia 08/28/2014  . Family history of premature coronary artery disease 08/12/2014  . Barrett's esophagus 01/10/2012  . Rosacea 03/02/2011  . Osteopenia 08/31/2009  . COLONIC POLYPS, HX OF 08/31/2009    Past Surgical History:  Procedure Laterality Date  . COLONOSCOPY    . COLONOSCOPY W/ POLYPECTOMY    . HYSTEROSCOPY    . POLYPECTOMY  2007   done by surgeon, Dr Dalbert Batman  . RECTAL POLYPECTOMY    . WISDOM TOOTH EXTRACTION      OB History    No data available       Home Medications    Prior to Admission medications   Medication Sig Start Date End Date Taking? Authorizing Provider  Calcium Carb-Cholecalciferol (CALCIUM-VITAMIN D) 600-400 MG-UNIT TABS Take 1 tablet by mouth 2 (two) times daily.    [provider]  metroNIDAZOLE (METROCREAM) 0.75 % cream  12/01/11   [provider]  omeprazole-sodium bicarbonate (ZEGERID) 40-1100 MG per capsule Take 1 capsule by mouth 2 (two) times daily. Patient  taking differently: Take 1 capsule by mouth daily.  12/18/11   Inda Castle, MD    Family History Family History  Problem Relation Age of Onset  . Uterine cancer Sister        clear cell  . Melanoma Sister   . Hypertension Sister   . Asthma Sister   . Coronary artery disease Father        also Alsheimer's; Parkinson's  . Coronary artery disease Paternal Grandfather        also CVA  . Heart attack Maternal Grandfather        < 55  . Stroke Paternal Grandmother        > 65; also Alsheimer's  . Dementia Mother        also Osteoporosis  . Irritable bowel syndrome Mother   . Heart attack Maternal Grandmother        < 65;also Alsheimer's  . Dementia Maternal Uncle   . Colon cancer Neg Hx   . Stomach cancer Neg Hx   . Esophageal cancer Neg Hx   . Diabetes Neg Hx     Social History Social History   Tobacco Use  . Smoking status: Never Smoker  . Smokeless tobacco: Never Used  Substance Use Topics  . Alcohol use: Yes    Alcohol/week: 4.2 oz    Types: 7 Glasses of wine per week  . Drug use: No  Allergies   Patient has no known allergies.   Review of Systems Review of Systems  Constitutional: Negative.   HENT: Negative.   Respiratory: Negative.   Cardiovascular: Negative.   Gastrointestinal: Positive for abdominal pain, constipation and nausea.  Musculoskeletal: Negative.   Skin: Negative.   Neurological: Negative.   Psychiatric/Behavioral: Negative.   All other systems reviewed and are negative.    Physical Exam Updated Vital Signs BP 114/88 (BP Location: Right Arm)   Pulse 100   Temp 99.5 F (37.5 C) (Oral)   Resp 16   SpO2 97%   Physical Exam  Constitutional: She appears well-developed and well-nourished.  HENT:  Head: Normocephalic and atraumatic.  Eyes: Conjunctivae are normal. Pupils are equal, round, and reactive to light.  Neck: Neck supple. No tracheal deviation present. No thyromegaly present.  Cardiovascular: Normal rate and regular  rhythm.  No murmur heard. Pulmonary/Chest: Effort normal and breath sounds normal.  Abdominal: Soft. Bowel sounds are normal. She exhibits no distension. There is tenderness. There is no guarding.  Tender over periumbilical area and lower quadrants  Genitourinary: Rectal exam shows guaiac negative stool.  Genitourinary Comments: Rectum normal tone no gross blood nontender  Musculoskeletal: Normal range of motion. She exhibits no edema or tenderness.  Neurological: She is alert. Coordination normal.  Skin: Skin is warm and dry. No rash noted.  Psychiatric: She has a normal mood and affect.  Nursing note and vitals reviewed.    ED Treatments / Results  Labs (all labs ordered are listed, but only abnormal results are displayed) Labs Reviewed  CBC  LIPASE, BLOOD  COMPREHENSIVE METABOLIC PANEL  URINALYSIS, ROUTINE W REFLEX MICROSCOPIC  POC OCCULT BLOOD, ED    EKG  EKG Interpretation None       Radiology No results found.  Procedures Procedures (including critical care time)  Medications Ordered in ED Medications  sodium chloride 0.9 % bolus 1,000 mL (not administered)  morphine 2 MG/ML injection 2 mg (not administered)   Results for orders placed or performed during the hospital encounter of 09/27/17  Lipase, blood  Result Value Ref Range   Lipase 20 11 - 51 U/L  Comprehensive metabolic panel  Result Value Ref Range   Sodium 138 135 - 145 mmol/L   Potassium 4.0 3.5 - 5.1 mmol/L   Chloride 104 101 - 111 mmol/L   CO2 25 22 - 32 mmol/L   Glucose, Bld 109 (H) 65 - 99 mg/dL   BUN 12 6 - 20 mg/dL   Creatinine, Ser 0.80 0.44 - 1.00 mg/dL   Calcium 8.9 8.9 - 10.3 mg/dL   Total Protein 7.5 6.5 - 8.1 g/dL   Albumin 3.6 3.5 - 5.0 g/dL   AST 24 15 - 41 U/L   ALT 24 14 - 54 U/L   Alkaline Phosphatase 63 38 - 126 U/L   Total Bilirubin 0.9 0.3 - 1.2 mg/dL   GFR calc non Af Amer >60 >60 mL/min   GFR calc Af Amer >60 >60 mL/min   Anion gap 9 5 - 15  CBC  Result Value Ref  Range   WBC 8.4 4.0 - 10.5 K/uL   RBC 4.27 3.87 - 5.11 MIL/uL   Hemoglobin 12.9 12.0 - 15.0 g/dL   HCT 39.4 36.0 - 46.0 %   MCV 92.3 78.0 - 100.0 fL   MCH 30.2 26.0 - 34.0 pg   MCHC 32.7 30.0 - 36.0 g/dL   RDW 13.5 11.5 - 15.5 %   Platelets 314  150 - 400 K/uL  Urinalysis, Routine w reflex microscopic  Result Value Ref Range   Color, Urine YELLOW YELLOW   APPearance CLEAR CLEAR   Specific Gravity, Urine 1.011 1.005 - 1.030   pH 6.0 5.0 - 8.0   Glucose, UA NEGATIVE NEGATIVE mg/dL   Hgb urine dipstick LARGE (A) NEGATIVE   Bilirubin Urine NEGATIVE NEGATIVE   Ketones, ur NEGATIVE NEGATIVE mg/dL   Protein, ur 100 (A) NEGATIVE mg/dL   Nitrite NEGATIVE NEGATIVE   Leukocytes, UA NEGATIVE NEGATIVE   RBC / HPF TOO NUMEROUS TO COUNT 0 - 5 RBC/hpf   WBC, UA 6-30 0 - 5 WBC/hpf   Bacteria, UA NONE SEEN NONE SEEN   Squamous Epithelial / LPF 0-5 (A) NONE SEEN   Mucus PRESENT   POC occult blood, ED  Result Value Ref Range   Fecal Occult Bld NEGATIVE NEGATIVE   Ct Abdomen Pelvis W Contrast  Result Date: 09/27/2017 CLINICAL DATA:  Ongoing constipation. EXAM: CT ABDOMEN AND PELVIS WITH CONTRAST TECHNIQUE: Multidetector CT imaging of the abdomen and pelvis was performed using the standard protocol following bolus administration of intravenous contrast. CONTRAST:  100 cc Omnipaque 300 intravenously. COMPARISON:  None. FINDINGS: Lower chest: Moderate to large hiatal hernia. Hepatobiliary: No focal liver abnormality is seen. No gallstones, gallbladder wall thickening, or biliary dilatation. Pancreas: Unremarkable. No pancreatic ductal dilatation or surrounding inflammatory changes. Spleen: Normal in size without focal abnormality. Adrenals/Urinary Tract: Adrenal glands are unremarkable. Kidneys are normal, without renal calculi, focal lesion, or hydronephrosis. Bladder is unremarkable. Stomach/Bowel: Stomach is within normal limits. No evidence of bowel wall thickening, distention, or inflammatory changes.  The appendix is fluid-filled and thickened to 15 mm. Rim enhancing hypoattenuated fluid collection is identified at the tip of the appendix which measures 3.6 x 3.7 x 3.9 cm. Scattered colonic diverticulosis. Vascular/Lymphatic: No significant vascular findings are present. Mildly enlarged mesenteric lymph nodes in the right lower outer quadrant, measuring up to 12 mm in short axis. Reproductive: Uterus and bilateral adnexa are unremarkable. Partially calcified fibroids noted. Other: No abdominal wall hernia or abnormality. No abdominopelvic ascites. Musculoskeletal: No acute or significant osseous findings. Mild osteoarthritic changes in the lower lumbosacral spine. IMPRESSION: Acute appendicitis with 3.9 cm rim enhancing hypoattenuated complex fluid collection versus a mucocele lesion at the tip of the appendix. This may represent a periappendiceal abscess versus a mucocele. Adjacent inflammatory mesenteric stranding and mild mesenteric lymphadenopathy favor a periappendiceal abscess. Moderate hiatal hernia. No acute abnormalities within the solid abdominal organs. These results were called by telephone at the time of interpretation on 09/27/2017 at 5:01 pm to Dr. Orlie Dakin , who verbally acknowledged these results. Electronically Signed   By: Fidela Salisbury M.D.   On: 09/27/2017 17:01    Initial Impression / Assessment and Plan / ED Course  I have reviewed the triage vital signs and the nursing notes.  Pertinent labs & imaging results that were available during my care of the patient were reviewed by me and considered in my medical decision making (see chart for details).     5 PM patient resting comfortably, asymptomatic after treatment with Tylenol and with intravenous morphine.  I consulted general surgery who will come to evaluate patient in the emergency department and arrange for admission  Final Clinical Impressions(s) / ED Diagnoses  Dx#1 acute appendicitis #2 microscopic  hematuria Final diagnoses:  None    ED Discharge Orders    None       Orlie Dakin, MD 09/27/17  1712  

## 2017-09-27 NOTE — H&P (Signed)
Nancy Carr is an 71 y.o. female.   Chief Complaint: abdominal pain HPI:  Pt is a 71 yo F who presents with around 10 days of intermittent abdominal pain.  This started a week and a half ago and she "just felt off." She described a headache/nausea and feeling of malaise.  Her abdomen was a little bloated and uncomfortable and she thought she was constipated.  She took some laxatives.  This did not amount to much of a bowel movement, but her pain improved the end of the week.  Three days ago the pain came back and was worse.  She describes it as a crampy constant pain that is mild to moderate, but definitely bothersome.  She has no appetite and has had low grade temps this week.  She had labs drawn Tuesday (2 days ago) [facility] and these were normal.  She had a colonoscopy 9 months ago that was normal other than some sigmoid diverticuli.  Nothing really makes the pain better or worse.  She is now 2 days out from a BM.  She is having some flatus.    She has no history of surgery via the abdominal wall.  She has a history of a rectal villous adenoma s/p transanal excision in 2007.  Her sister had uterine cancer.    Past Medical History:  Diagnosis Date  . GERD (gastroesophageal reflux disease)    mild  . Hx of colonic polyps 2007   villous adenoma   . Osteoporosis   . Rosacea     Past Surgical History:  Procedure Laterality Date  . COLONOSCOPY    . COLONOSCOPY W/ POLYPECTOMY    . HYSTEROSCOPY    . POLYPECTOMY  2007   done by surgeon, Dr Nancy Carr  . RECTAL POLYPECTOMY    . WISDOM TOOTH EXTRACTION      Family History  Problem Relation Age of Onset  . Uterine cancer Sister        clear cell  . Melanoma Sister   . Hypertension Sister   . Asthma Sister   . Coronary artery disease Father        also Alsheimer's; Parkinson's  . Coronary artery disease Paternal Grandfather        also CVA  . Heart attack Maternal Grandfather        < 55  . Stroke Paternal Grandmother         > 65; also Alsheimer's  . Dementia Mother        also Osteoporosis  . Irritable bowel syndrome Mother   . Heart attack Maternal Grandmother        < 65;also Alsheimer's  . Dementia Maternal Uncle   . Colon cancer Neg Hx   . Stomach cancer Neg Hx   . Esophageal cancer Neg Hx   . Diabetes Neg Hx    Social History:  reports that  has never smoked. she has never used smokeless tobacco. She reports that she drinks about 4.2 oz of alcohol per week. She reports that she does not use drugs.  Allergies: No Known Allergies  Current Meds  Medication Sig  . Bisacodyl (DULCOLAX PO) Take 2 tablets by mouth daily as needed (CONSTIPATION).  . Calcium Carb-Cholecalciferol (CALCIUM-VITAMIN D) 600-400 MG-UNIT TABS Take 1 tablet by mouth 2 (two) times daily.  Marland Kitchen ibuprofen (ADVIL,MOTRIN) 200 MG tablet Take 400 mg by mouth daily as needed for moderate pain.  . Ibuprofen-Diphenhydramine Cit (ADVIL PM PO) Take 1 tablet by  mouth at bedtime as needed (SLEEP).  Marland Kitchen omeprazole-sodium bicarbonate (ZEGERID) 40-1100 MG per capsule Take 1 capsule by mouth 2 (two) times daily. (Patient taking differently: Take 1 capsule by mouth daily. )  . polyethylene glycol (MIRALAX / GLYCOLAX) packet Take 17 g by mouth daily as needed for moderate constipation.  . Sodium Phosphates (FLEET ENEMA RE) Place 1 enema rectally daily as needed (CONSTIPATION).     Results for orders placed or performed during the hospital encounter of 09/27/17 (from the past 48 hour(s))  Lipase, blood     Status: None   Collection Time: 09/27/17  1:20 PM  Result Value Ref Range   Lipase 20 11 - 51 U/L  Comprehensive metabolic panel     Status: Abnormal   Collection Time: 09/27/17  1:20 PM  Result Value Ref Range   Sodium 138 135 - 145 mmol/L   Potassium 4.0 3.5 - 5.1 mmol/L   Chloride 104 101 - 111 mmol/L   CO2 25 22 - 32 mmol/L   Glucose, Bld 109 (H) 65 - 99 mg/dL   BUN 12 6 - 20 mg/dL   Creatinine, Ser 0.80 0.44 - 1.00 mg/dL   Calcium  8.9 8.9 - 10.3 mg/dL   Total Protein 7.5 6.5 - 8.1 g/dL   Albumin 3.6 3.5 - 5.0 g/dL   AST 24 15 - 41 U/L   ALT 24 14 - 54 U/L   Alkaline Phosphatase 63 38 - 126 U/L   Total Bilirubin 0.9 0.3 - 1.2 mg/dL   GFR calc non Af Amer >60 >60 mL/min   GFR calc Af Amer >60 >60 mL/min    Comment: (NOTE) The eGFR has been calculated using the CKD EPI equation. This calculation has not been validated in all clinical situations. eGFR's persistently <60 mL/min signify possible Chronic Kidney Disease.    Anion gap 9 5 - 15  CBC     Status: None   Collection Time: 09/27/17  1:20 PM  Result Value Ref Range   WBC 8.4 4.0 - 10.5 K/uL   RBC 4.27 3.87 - 5.11 MIL/uL   Hemoglobin 12.9 12.0 - 15.0 g/dL   HCT 39.4 36.0 - 46.0 %   MCV 92.3 78.0 - 100.0 fL   MCH 30.2 26.0 - 34.0 pg   MCHC 32.7 30.0 - 36.0 g/dL   RDW 13.5 11.5 - 15.5 %   Platelets 314 150 - 400 K/uL  POC occult blood, ED     Status: None   Collection Time: 09/27/17  1:50 PM  Result Value Ref Range   Fecal Occult Bld NEGATIVE NEGATIVE  Urinalysis, Routine w reflex microscopic     Status: Abnormal   Collection Time: 09/27/17  2:30 PM  Result Value Ref Range   Color, Urine YELLOW YELLOW   APPearance CLEAR CLEAR   Specific Gravity, Urine 1.011 1.005 - 1.030   pH 6.0 5.0 - 8.0   Glucose, UA NEGATIVE NEGATIVE mg/dL   Hgb urine dipstick LARGE (A) NEGATIVE   Bilirubin Urine NEGATIVE NEGATIVE   Ketones, ur NEGATIVE NEGATIVE mg/dL   Protein, ur 100 (A) NEGATIVE mg/dL   Nitrite NEGATIVE NEGATIVE   Leukocytes, UA NEGATIVE NEGATIVE   RBC / HPF TOO NUMEROUS TO COUNT 0 - 5 RBC/hpf   WBC, UA 6-30 0 - 5 WBC/hpf   Bacteria, UA NONE SEEN NONE SEEN   Squamous Epithelial / LPF 0-5 (A) NONE SEEN   Mucus PRESENT    Ct Abdomen Pelvis W Contrast  Result  Date: 09/27/2017 CLINICAL DATA:  Ongoing constipation. EXAM: CT ABDOMEN AND PELVIS WITH CONTRAST TECHNIQUE: Multidetector CT imaging of the abdomen and pelvis was performed using the standard  protocol following bolus administration of intravenous contrast. CONTRAST:  100 cc Omnipaque 300 intravenously. COMPARISON:  None. FINDINGS: Lower chest: Moderate to large hiatal hernia. Hepatobiliary: No focal liver abnormality is seen. No gallstones, gallbladder wall thickening, or biliary dilatation. Pancreas: Unremarkable. No pancreatic ductal dilatation or surrounding inflammatory changes. Spleen: Normal in size without focal abnormality. Adrenals/Urinary Tract: Adrenal glands are unremarkable. Kidneys are normal, without renal calculi, focal lesion, or hydronephrosis. Bladder is unremarkable. Stomach/Bowel: Stomach is within normal limits. No evidence of bowel wall thickening, distention, or inflammatory changes. The appendix is fluid-filled and thickened to 15 mm. Rim enhancing hypoattenuated fluid collection is identified at the tip of the appendix which measures 3.6 x 3.7 x 3.9 cm. Scattered colonic diverticulosis. Vascular/Lymphatic: No significant vascular findings are present. Mildly enlarged mesenteric lymph nodes in the right lower outer quadrant, measuring up to 12 mm in short axis. Reproductive: Uterus and bilateral adnexa are unremarkable. Partially calcified fibroids noted. Other: No abdominal wall hernia or abnormality. No abdominopelvic ascites. Musculoskeletal: No acute or significant osseous findings. Mild osteoarthritic changes in the lower lumbosacral spine. IMPRESSION: Acute appendicitis with 3.9 cm rim enhancing hypoattenuated complex fluid collection versus a mucocele lesion at the tip of the appendix. This may represent a periappendiceal abscess versus a mucocele. Adjacent inflammatory mesenteric stranding and mild mesenteric lymphadenopathy favor a periappendiceal abscess. Moderate hiatal hernia. No acute abnormalities within the solid abdominal organs. These results were called by telephone at the time of interpretation on 09/27/2017 at 5:01 pm to Dr. Orlie Dakin , who verbally  acknowledged these results. Electronically Signed   By: Fidela Salisbury M.D.   On: 09/27/2017 17:01    Review of Systems  Constitutional: Positive for fever.  HENT: Negative.   Eyes: Negative.   Respiratory: Negative.   Cardiovascular: Negative.   Gastrointestinal: Positive for abdominal pain, constipation and nausea.  Genitourinary: Negative.   Musculoskeletal: Negative.   Skin: Negative.   Neurological: Negative.   Endo/Heme/Allergies: Negative.   Psychiatric/Behavioral: Negative.   All other systems reviewed and are negative.   Blood pressure 101/89, pulse 99, temperature (!) 101.7 F (38.7 C), temperature source Oral, resp. rate 18, SpO2 98 %. Physical Exam  Constitutional: She is oriented to person, place, and time. She appears well-developed and well-nourished. No distress.  HENT:  Head: Normocephalic and atraumatic.  Eyes: Conjunctivae are normal. Pupils are equal, round, and reactive to light. Right eye exhibits no discharge. Left eye exhibits no discharge.  Neck: Normal range of motion. Neck supple. No thyromegaly present.  Cardiovascular: Normal rate, regular rhythm, normal heart sounds and intact distal pulses.  Respiratory: Effort normal and breath sounds normal.  GI: Soft. She exhibits distension. She exhibits no mass. There is tenderness. There is no rebound and no guarding.  Musculoskeletal: Normal range of motion. She exhibits no edema, tenderness or deformity.  Neurological: She is alert and oriented to person, place, and time.  Skin: Skin is warm and dry. No rash noted. She is not diaphoretic. No erythema. No pallor.  Psychiatric: She has a normal mood and affect. Her behavior is normal. Judgment and thought content normal.     Assessment/Plan Acute appendicitis with abscess- slightly atypical story Plan laparoscopic appendectomy There is a risk that the abscess may represent a mucocele or ruptured mucocele.  I have discussed this possibility with the  patient and her friend, Benjamine Mola.    Appendectomy was described to the patient.  The incisions and surgical technique were explained.  The patient was advised that some of the hair on the abdomen would be clipped, and that a foley catheter would be placed.  I advised the patient of the risks of surgery including, but not limited to, bleeding, infection, damage to other structures, risk of an open operation, risk of abscess, and risk of blood clot.  The recovery was also described to the patient.  She was advised that he will have lifting restrictions for 2 weeks.      Stark Klein, MD 09/27/2017, 6:40 PM

## 2017-09-27 NOTE — Telephone Encounter (Signed)
Pt has been checked in at the ED

## 2017-09-27 NOTE — ED Triage Notes (Signed)
Pt complaint of ongoing constipation since Dec 11; seen at PCP this past Tuesday for same; no relief with medications given; "minimal output."

## 2017-09-27 NOTE — Telephone Encounter (Unsigned)
Copied from [place]. Topic: Quick Communication - See Telephone Encounter >> Sep 27, 2017  5:23 PM Boyd Kerbs wrote: CRM for notification. See Telephone encounter for:  Patient is returning Dr. Raeford Razor call,  She is at [facility] going to have Appendix out.   09/27/17.

## 2017-09-28 ENCOUNTER — Encounter (HOSPITAL_COMMUNITY): Payer: Self-pay | Admitting: General Surgery

## 2017-09-28 LAB — CBC
HCT: 35.6 % — ABNORMAL LOW (ref 36.0–46.0)
Hemoglobin: 11.8 g/dL — ABNORMAL LOW (ref 12.0–15.0)
MCH: 30.3 pg (ref 26.0–34.0)
MCHC: 33.1 g/dL (ref 30.0–36.0)
MCV: 91.5 fL (ref 78.0–100.0)
PLATELETS: 296 10*3/uL (ref 150–400)
RBC: 3.89 MIL/uL (ref 3.87–5.11)
RDW: 13.4 % (ref 11.5–15.5)
WBC: 12.4 10*3/uL — AB (ref 4.0–10.5)

## 2017-09-28 LAB — BASIC METABOLIC PANEL
ANION GAP: 9 (ref 5–15)
BUN: 9 mg/dL (ref 6–20)
CO2: 23 mmol/L (ref 22–32)
Calcium: 8.3 mg/dL — ABNORMAL LOW (ref 8.9–10.3)
Chloride: 106 mmol/L (ref 101–111)
Creatinine, Ser: 0.67 mg/dL (ref 0.44–1.00)
GFR calc Af Amer: >60 mL/min (ref >60)
Glucose, Bld: 134 mg/dL — ABNORMAL HIGH (ref 65–99)
POTASSIUM: 3.8 mmol/L (ref 3.5–5.1)
SODIUM: 138 mmol/L (ref 135–145)

## 2017-09-28 MED ORDER — DOCUSATE SODIUM 100 MG PO CAPS
100.0000 mg | ORAL_CAPSULE | Freq: Two times a day (BID) | ORAL | Status: DC
  Administered 2017-09-28 – 2017-09-29 (×3): 100 mg via ORAL
  Filled 2017-09-28 (×5): qty 1

## 2017-09-28 NOTE — Progress Notes (Signed)
1 Day Post-Op   Subjective/Chief Complaint: Feels pretty well   Objective: Vital signs in last 24 hours: Temp:  [98.1 F (36.7 C)-101.7 F (38.7 C)] 98.8 F (37.1 C) (12/21 0630) Pulse Rate:  [85-100] 88 (12/21 0630) Resp:  [14-22] 20 (12/21 0630) BP: (100-130)/(60-89) 105/63 (12/21 0630) SpO2:  [90 %-100 %] 93 % (12/21 0630)    Intake/Output from previous day: 12/20 0701 - 12/21 0700 In: 2651.7 [P.O.:90; I.V.:1461.7; IV Piggyback:1100] Out: 1250 [Urine:1150; Blood:100] Intake/Output this shift: No intake/output data recorded.  General appearance: alert and cooperative Resp: clear to auscultation bilaterally GI: soft, mildly distended, appropriately tender Skin: Skin color, texture, turgor normal. No rashes or lesions Neurologic: Grossly normal Incision/Wound:  C/d/i dermabond Lab Results:  Recent Labs    09/27/17 1320 09/28/17 0518  WBC 8.4 12.4*  HGB 12.9 11.8*  HCT 39.4 35.6*  PLT 314 296   BMET Recent Labs    09/27/17 1320 09/28/17 0518  NA 138 138  K 4.0 3.8  CL 104 106  CO2 25 23  GLUCOSE 109* 134*  BUN 12 9  CREATININE 0.80 0.67  CALCIUM 8.9 8.3*   PT/INR No results for input(s): LABPROT, INR in the last 72 hours. ABG No results for input(s): PHART, HCO3 in the last 72 hours.  Invalid input(s): PCO2, PO2  Studies/Results: Ct Abdomen Pelvis W Contrast  Result Date: 09/27/2017 CLINICAL DATA:  Ongoing constipation. EXAM: CT ABDOMEN AND PELVIS WITH CONTRAST TECHNIQUE: Multidetector CT imaging of the abdomen and pelvis was performed using the standard protocol following bolus administration of intravenous contrast. CONTRAST:  100 cc Omnipaque 300 intravenously. COMPARISON:  None. FINDINGS: Lower chest: Moderate to large hiatal hernia. Hepatobiliary: No focal liver abnormality is seen. No gallstones, gallbladder wall thickening, or biliary dilatation. Pancreas: Unremarkable. No pancreatic ductal dilatation or surrounding inflammatory changes. Spleen:  Normal in size without focal abnormality. Adrenals/Urinary Tract: Adrenal glands are unremarkable. Kidneys are normal, without renal calculi, focal lesion, or hydronephrosis. Bladder is unremarkable. Stomach/Bowel: Stomach is within normal limits. No evidence of bowel wall thickening, distention, or inflammatory changes. The appendix is fluid-filled and thickened to 15 mm. Rim enhancing hypoattenuated fluid collection is identified at the tip of the appendix which measures 3.6 x 3.7 x 3.9 cm. Scattered colonic diverticulosis. Vascular/Lymphatic: No significant vascular findings are present. Mildly enlarged mesenteric lymph nodes in the right lower outer quadrant, measuring up to 12 mm in short axis. Reproductive: Uterus and bilateral adnexa are unremarkable. Partially calcified fibroids noted. Other: No abdominal wall hernia or abnormality. No abdominopelvic ascites. Musculoskeletal: No acute or significant osseous findings. Mild osteoarthritic changes in the lower lumbosacral spine. IMPRESSION: Acute appendicitis with 3.9 cm rim enhancing hypoattenuated complex fluid collection versus a mucocele lesion at the tip of the appendix. This may represent a periappendiceal abscess versus a mucocele. Adjacent inflammatory mesenteric stranding and mild mesenteric lymphadenopathy favor a periappendiceal abscess. Moderate hiatal hernia. No acute abnormalities within the solid abdominal organs. These results were called by telephone at the time of interpretation on 09/27/2017 at 5:01 pm to Dr. Orlie Dakin , who verbally acknowledged these results. Electronically Signed   By: Fidela Salisbury M.D.   On: 09/27/2017 17:01    Anti-infectives: Anti-infectives (From admission, onward)   Start     Dose/Rate Route Frequency Ordered Stop   09/27/17 2245  piperacillin-tazobactam (ZOSYN) IVPB 3.375 g     3.375 g 12.5 mL/hr over 240 Minutes Intravenous Every 8 hours 09/27/17 2237     09/27/17 1700  piperacillin-tazobactam  (ZOSYN) IVPB 3.375 g     3.375 g 100 mL/hr over 30 Minutes Intravenous  Once 09/27/17 1657 09/27/17 1736      Assessment/Plan: s/p Procedure(s): APPENDECTOMY LAPAROSCOPIC (N/A) Advance diet as tolerated, continue IV abx. Ambulate, IS  LOS: 0 days    Nancy Carr 09/28/2017

## 2017-09-28 NOTE — Addendum Note (Signed)
Addendum  created 09/28/17 0617 by Lollie Sails, CRNA   Charge Capture section accepted

## 2017-09-28 NOTE — Anesthesia Postprocedure Evaluation (Signed)
Anesthesia Post Note  Patient: Nancy Carr  Procedure(s) Performed: APPENDECTOMY LAPAROSCOPIC (N/A Abdomen)     Patient location during evaluation: PACU Anesthesia Type: General Level of consciousness: awake and alert Pain management: pain level controlled Vital Signs Assessment: post-procedure vital signs reviewed and stable Respiratory status: spontaneous breathing, nonlabored ventilation, respiratory function stable and patient connected to nasal cannula oxygen Cardiovascular status: blood pressure returned to baseline and stable Postop Assessment: no apparent nausea or vomiting Anesthetic complications: no    Last Vitals:  Vitals:   09/27/17 2350 09/28/17 0050  BP: 118/70 117/70  Pulse: 86 91  Resp:  20  Temp: 36.8 C 37.3 C  SpO2: 97% 96%    Last Pain:  Vitals:   09/28/17 0050  TempSrc: Oral  PainSc:                  Riccardo Dubin

## 2017-09-28 NOTE — Discharge Instructions (Signed)
Please arrive at least 30 min before your appointment to complete your check in paperwork.  If you are unable to arrive 30 min prior to your appointment time we may have to cancel or reschedule you. ° °LAPAROSCOPIC SURGERY: POST OP INSTRUCTIONS  °1. DIET: Follow a light bland diet the first 24 hours after arrival home, such as soup, liquids, crackers, etc. Be sure to include lots of fluids daily. Avoid fast food or heavy meals as your are more likely to get nauseated. Eat a low fat the next few days after surgery.  °2. Take your usually prescribed home medications unless otherwise directed. °3. PAIN CONTROL:  °1. Pain is best controlled by a usual combination of three different methods TOGETHER:  °1. Ice/Heat °2. Over the counter pain medication °3. Prescription pain medication °2. Most patients will experience some swelling and bruising around the incisions. Ice packs or heating pads (30-60 minutes up to 6 times a day) will help. Use ice for the first few days to help decrease swelling and bruising, then switch to heat to help relax tight/sore spots and speed recovery. Some people prefer to use ice alone, heat alone, alternating between ice & heat. Experiment to what works for you. Swelling and bruising can take several weeks to resolve.  °3. It is helpful to take an over-the-counter pain medication regularly for the first few weeks. Choose one of the following that works best for you:  °1. Naproxen (Aleve, etc) Two 220mg tabs twice a day °2. Ibuprofen (Advil, etc) Three 200mg tabs four times a day (every meal & bedtime) °3. Acetaminophen (Tylenol, etc) 500-650mg four times a day (every meal & bedtime) °4. A prescription for pain medication (such as oxycodone, hydrocodone, etc) should be given to you upon discharge. Take your pain medication as prescribed.  °1. If you are having problems/concerns with the prescription medicine (does not control pain, nausea, vomiting, rash, itching, etc), please call us [phone] to see if we need to switch you to a different pain medicine that will work better for you and/or control your side effect better. °2. If you need a refill on your pain medication, please contact your pharmacy. They will contact our office to request authorization. Prescriptions will not be filled after 5 pm or on week-ends. °4. Avoid getting constipated. Between the surgery and the pain medications, it is common to experience some constipation. Increasing fluid intake and taking a fiber supplement (such as Metamucil, Citrucel, FiberCon, MiraLax, etc) 1-2 times a day regularly will usually help prevent this problem from occurring. A mild laxative (prune juice, Milk of Magnesia, MiraLax, etc) should be taken according to package directions if there are no bowel movements after 48 hours.  °5. Watch out for diarrhea. If you have many loose bowel movements, simplify your diet to bland foods & liquids for a few days. Stop any stool softeners and decrease your fiber supplement. Switching to mild anti-diarrheal medications (Kayopectate, Pepto Bismol) can help. If this worsens or does not improve, please call us. °6. Wash / shower every day. You may shower over the dressings as they are waterproof. Continue to shower over incision(s) after the dressing is off. °7. Remove your waterproof bandages 5 days after surgery. You may leave the incision open to air. You may replace a dressing/Band-Aid to cover the incision for comfort if you wish.  °8. ACTIVITIES as tolerated:  °1. You may resume regular (light) daily activities beginning the next day--such as daily self-care, walking, climbing stairs--gradually   increasing activities as tolerated. If you can walk 30 minutes without difficulty, it is safe to try more intense activity such as jogging, treadmill, bicycling, low-impact aerobics, swimming, etc. °2. Save the most intensive and strenuous activity for last such as sit-ups, heavy lifting, contact sports, etc Refrain  from any heavy lifting or straining until you are off narcotics for pain control.  °3. DO NOT PUSH THROUGH PAIN. Let pain be your guide: If it hurts to do something, don't do it. Pain is your body warning you to avoid that activity for another week until the pain goes down. °4. You may drive when you are no longer taking prescription pain medication, you can comfortably wear a seatbelt, and you can safely maneuver your car and apply brakes. °5. You may have sexual intercourse when it is comfortable.  °9. FOLLOW UP in our office  °1. Please call [facility] at [phone] to set up an appointment to see your surgeon in the office for a follow-up appointment approximately 2-3 weeks after your surgery. °2. Make sure that you call for this appointment the day you arrive home to insure a convenient appointment time. °     10. IF YOU HAVE DISABILITY OR FAMILY LEAVE FORMS, BRING THEM TO THE               OFFICE FOR PROCESSING.  ° °WHEN TO CALL US [phone]:  °1. Poor pain control °2. Reactions / problems with new medications (rash/itching, nausea, etc)  °3. Fever over 101.5 F (38.5 C) °4. Inability to urinate °5. Nausea and/or vomiting °6. Worsening swelling or bruising °7. Continued bleeding from incision. °8. Increased pain, redness, or drainage from the incision ° °The clinic staff is available to answer your questions during regular business hours (8:30am-5pm). Please don’t hesitate to call and ask to speak to one of our nurses for clinical concerns.  °If you have a medical emergency, go to the nearest emergency room or call 911.  °A surgeon from [facility] is always on call at the hospitals  ° °[facility]  °[address], [address], [place], [place] [address] ?  °MAIN: [phone] ? TOLL FREE: [phone] ?  °FAX [phone]  °[email or web address] ° °

## 2017-09-29 DIAGNOSIS — K3533 Acute appendicitis with perforation and localized peritonitis, with abscess: Secondary | ICD-10-CM | POA: Diagnosis present

## 2017-09-29 DIAGNOSIS — M810 Age-related osteoporosis without current pathological fracture: Secondary | ICD-10-CM | POA: Diagnosis present

## 2017-09-29 DIAGNOSIS — K3580 Unspecified acute appendicitis: Secondary | ICD-10-CM | POA: Diagnosis not present

## 2017-09-29 DIAGNOSIS — Z79899 Other long term (current) drug therapy: Secondary | ICD-10-CM | POA: Diagnosis not present

## 2017-09-29 LAB — BASIC METABOLIC PANEL
Anion gap: 6 (ref 5–15)
BUN: 9 mg/dL (ref 6–20)
CALCIUM: 8.2 mg/dL — AB (ref 8.9–10.3)
CO2: 27 mmol/L (ref 22–32)
CREATININE: 0.63 mg/dL (ref 0.44–1.00)
Chloride: 104 mmol/L (ref 101–111)
GFR calc Af Amer: >60 mL/min (ref >60)
GLUCOSE: 119 mg/dL — AB (ref 65–99)
Potassium: 4 mmol/L (ref 3.5–5.1)
Sodium: 137 mmol/L (ref 135–145)

## 2017-09-29 LAB — CBC
HCT: 31.1 % — ABNORMAL LOW (ref 36.0–46.0)
Hemoglobin: 10.1 g/dL — ABNORMAL LOW (ref 12.0–15.0)
MCH: 30.1 pg (ref 26.0–34.0)
MCHC: 32.5 g/dL (ref 30.0–36.0)
MCV: 92.8 fL (ref 78.0–100.0)
PLATELETS: 273 10*3/uL (ref 150–400)
RBC: 3.35 MIL/uL — ABNORMAL LOW (ref 3.87–5.11)
RDW: 13.5 % (ref 11.5–15.5)
WBC: 8.5 10*3/uL (ref 4.0–10.5)

## 2017-09-29 LAB — URINE CULTURE: Culture: >=100000 — AB

## 2017-09-29 MED ORDER — PANTOPRAZOLE SODIUM 40 MG PO TBEC
40.0000 mg | DELAYED_RELEASE_TABLET | Freq: Every day | ORAL | Status: DC
  Administered 2017-09-29: 40 mg via ORAL
  Filled 2017-09-29: qty 1

## 2017-09-29 NOTE — Progress Notes (Signed)
2 Days Post-Op   Subjective/Chief Complaint: Tolerated liquids but no lower GI function yet.    Objective: Vital signs in last 24 hours: Temp:  [98.5 F (36.9 C)-99.8 F (37.7 C)] 99.3 F (37.4 C) (12/22 0445) Pulse Rate:  [80-92] 92 (12/22 0445) Resp:  [18] 18 (12/22 0445) BP: (96-109)/(61-66) 109/66 (12/22 0445) SpO2:  [92 %-95 %] 92 % (12/22 0445) Last BM Date: 09/25/17  Intake/Output from previous day: 12/21 0701 - 12/22 0700 In: 1600 [I.V.:1500; IV Piggyback:100] Out: 900 [Urine:900] Intake/Output this shift: No intake/output data recorded.  General appearance: alert and cooperative Resp: clear to auscultation bilaterally GI: soft, mildly distended, appropriately tender Skin: Skin color, texture, turgor normal. No rashes or lesions Neurologic: Grossly normal Incision/Wound:  C/d/i dermabond Lab Results:  Recent Labs    09/28/17 0518 09/29/17 0424  WBC 12.4* 8.5  HGB 11.8* 10.1*  HCT 35.6* 31.1*  PLT 296 273   BMET Recent Labs    09/28/17 0518 09/29/17 0424  NA 138 137  K 3.8 4.0  CL 106 104  CO2 23 27  GLUCOSE 134* 119*  BUN 9 9  CREATININE 0.67 0.63  CALCIUM 8.3* 8.2*   PT/INR No results for input(s): LABPROT, INR in the last 72 hours. ABG No results for input(s): PHART, HCO3 in the last 72 hours.  Invalid input(s): PCO2, PO2  Studies/Results: Ct Abdomen Pelvis W Contrast  Result Date: 09/27/2017 CLINICAL DATA:  Ongoing constipation. EXAM: CT ABDOMEN AND PELVIS WITH CONTRAST TECHNIQUE: Multidetector CT imaging of the abdomen and pelvis was performed using the standard protocol following bolus administration of intravenous contrast. CONTRAST:  100 cc Omnipaque 300 intravenously. COMPARISON:  None. FINDINGS: Lower chest: Moderate to large hiatal hernia. Hepatobiliary: No focal liver abnormality is seen. No gallstones, gallbladder wall thickening, or biliary dilatation. Pancreas: Unremarkable. No pancreatic ductal dilatation or surrounding  inflammatory changes. Spleen: Normal in size without focal abnormality. Adrenals/Urinary Tract: Adrenal glands are unremarkable. Kidneys are normal, without renal calculi, focal lesion, or hydronephrosis. Bladder is unremarkable. Stomach/Bowel: Stomach is within normal limits. No evidence of bowel wall thickening, distention, or inflammatory changes. The appendix is fluid-filled and thickened to 15 mm. Rim enhancing hypoattenuated fluid collection is identified at the tip of the appendix which measures 3.6 x 3.7 x 3.9 cm. Scattered colonic diverticulosis. Vascular/Lymphatic: No significant vascular findings are present. Mildly enlarged mesenteric lymph nodes in the right lower outer quadrant, measuring up to 12 mm in short axis. Reproductive: Uterus and bilateral adnexa are unremarkable. Partially calcified fibroids noted. Other: No abdominal wall hernia or abnormality. No abdominopelvic ascites. Musculoskeletal: No acute or significant osseous findings. Mild osteoarthritic changes in the lower lumbosacral spine. IMPRESSION: Acute appendicitis with 3.9 cm rim enhancing hypoattenuated complex fluid collection versus a mucocele lesion at the tip of the appendix. This may represent a periappendiceal abscess versus a mucocele. Adjacent inflammatory mesenteric stranding and mild mesenteric lymphadenopathy favor a periappendiceal abscess. Moderate hiatal hernia. No acute abnormalities within the solid abdominal organs. These results were called by telephone at the time of interpretation on 09/27/2017 at 5:01 pm to Dr. Orlie Dakin , who verbally acknowledged these results. Electronically Signed   By: Fidela Salisbury M.D.   On: 09/27/2017 17:01    Anti-infectives: Anti-infectives (From admission, onward)   Start     Dose/Rate Route Frequency Ordered Stop   09/27/17 2245  piperacillin-tazobactam (ZOSYN) IVPB 3.375 g     3.375 g 12.5 mL/hr over 240 Minutes Intravenous Every 8 hours 09/27/17 2237  09/27/17  1700  piperacillin-tazobactam (ZOSYN) IVPB 3.375 g     3.375 g 100 mL/hr over 30 Minutes Intravenous  Once 09/27/17 1657 09/27/17 1736      Assessment/Plan: s/p Procedure(s): APPENDECTOMY LAPAROSCOPIC (N/A) Advance diet as tolerated, continue IV abx. Ambulate, IS  Labs OK today. Wouldn't be surprised if she develops ileus. Continue to monitor today.   LOS: 0 days    Nancy Carr 09/29/2017

## 2017-09-29 NOTE — Progress Notes (Signed)
Key Points: Use following P&T approved IV to PO non-antibiotic change policy.  Description contains the criteria that are approved Note: Policy Excludes:  Esophagectomy patientsPHARMACIST - PHYSICIAN COMMUNICATION DR:   Kae Heller CONCERNING: IV to Oral Route Change Policy  RECOMMENDATION: This patient is receiving protonix by the intravenous route.  Based on criteria approved by the Pharmacy and Therapeutics Committee, the intravenous medication(s) is/are being converted to the equivalent oral dose form(s).   DESCRIPTION: These criteria include:  The patient is eating (either orally or via tube) and/or has been taking other orally administered medications for a least 24 hours  The patient has no evidence of active gastrointestinal bleeding or impaired GI absorption (gastrectomy, short bowel, patient on TNA or NPO).  If you have questions about this conversion, please contact the Pharmacy Department  []   [phone] )  [facility] []   [phone] )  [facility]  []   [phone] )  [facility] [x]   [phone] )  [place], [place], [facility] 09/29/2017 1:10 PM

## 2017-09-30 LAB — CBC
HCT: 31.5 % — ABNORMAL LOW (ref 36.0–46.0)
HEMOGLOBIN: 10.5 g/dL — AB (ref 12.0–15.0)
MCH: 30.9 pg (ref 26.0–34.0)
MCHC: 33.3 g/dL (ref 30.0–36.0)
MCV: 92.6 fL (ref 78.0–100.0)
PLATELETS: 301 10*3/uL (ref 150–400)
RBC: 3.4 MIL/uL — AB (ref 3.87–5.11)
RDW: 13.7 % (ref 11.5–15.5)
WBC: 8.2 10*3/uL (ref 4.0–10.5)

## 2017-09-30 LAB — BASIC METABOLIC PANEL
Anion gap: 7 (ref 5–15)
BUN: 8 mg/dL (ref 6–20)
CHLORIDE: 108 mmol/L (ref 101–111)
CO2: 25 mmol/L (ref 22–32)
Calcium: 8.2 mg/dL — ABNORMAL LOW (ref 8.9–10.3)
Creatinine, Ser: 0.53 mg/dL (ref 0.44–1.00)
GFR calc Af Amer: >60 mL/min (ref >60)
GFR calc non Af Amer: >60 mL/min (ref >60)
GLUCOSE: 102 mg/dL — AB (ref 65–99)
POTASSIUM: 3.9 mmol/L (ref 3.5–5.1)
Sodium: 140 mmol/L (ref 135–145)

## 2017-09-30 MED ORDER — DOCUSATE SODIUM 100 MG PO CAPS: 100.0000 mg | ORAL_CAPSULE | Freq: Two times a day (BID) | ORAL | 0 refills | Status: DC

## 2017-09-30 MED ORDER — TRAMADOL HCL 50 MG PO TABS: 50.0000 mg | ORAL_TABLET | Freq: Four times a day (QID) | ORAL | 0 refills | Status: DC | PRN

## 2017-09-30 MED ORDER — AMOXICILLIN-POT CLAVULANATE 875-125 MG PO TABS: 1.0000 | ORAL_TABLET | Freq: Two times a day (BID) | ORAL | 0 refills | Status: AC

## 2017-09-30 NOTE — Discharge Summary (Signed)
Physician Discharge Summary  Patient ID: Nancy Carr MRN: [id number] DOB/AGE: Apr 13, 1946 71 y.o.  Admit date: 09/27/2017 Discharge date: 09/30/2017  Admission Diagnoses:  Discharge Diagnoses:  Active Problems:   Acute appendicitis   Discharged Condition: good  Hospital Course: Admitted for supportive care following laparoscopic appendectomy for perforated appendicitis. Her diet was gradually advanced and her bowel function resumed. Her pain control was adequate. She was ambulating unassisted and her vitals, labs, and urine output were normal. She was deemed stable for discharge  Consults: None  Significant Diagnostic Studies: labs: see epic  Treatments: surgery: as above  Discharge Exam: Blood pressure 131/82, pulse 85, temperature 97.8 F (36.6 C), temperature source Oral, resp. rate 16, height 5' 0.98" (1.549 m), weight 69.9 kg (154 lb), SpO2 94 %. General appearance: alert and cooperative Resp: clear to auscultation bilaterally GI: soft nondistended, appropriately minimally tender Skin: Skin color, texture, turgor normal. No rashes or lesions Neurologic: Grossly normal Incision/Wound: c/d/i, some bruising  Disposition:   Discharge Instructions    Increase activity slowly   Complete by:  As directed      Allergies as of 09/30/2017   No Known Allergies     Medication List    TAKE these medications   ADVIL PM PO Take 1 tablet by mouth at bedtime as needed (SLEEP).   amoxicillin-clavulanate 875-125 MG tablet Commonly known as:  AUGMENTIN Take 1 tablet by mouth 2 (two) times daily for 7 days.   Calcium-Vitamin D 600-400 MG-UNIT Tabs Take 1 tablet by mouth 2 (two) times daily.   docusate sodium 100 MG capsule Commonly known as:  COLACE Take 1 capsule (100 mg total) by mouth 2 (two) times daily.   DULCOLAX PO Take 2 tablets by mouth daily as needed (CONSTIPATION).   FLEET ENEMA RE Place 1 enema rectally daily as needed (CONSTIPATION).   ibuprofen  200 MG tablet Commonly known as:  ADVIL,MOTRIN Take 400 mg by mouth daily as needed for moderate pain.   omeprazole-sodium bicarbonate 40-1100 MG capsule Commonly known as:  ZEGERID Take 1 capsule by mouth 2 (two) times daily. What changed:  when to take this   polyethylene glycol packet Commonly known as:  MIRALAX / GLYCOLAX Take 17 g by mouth daily as needed for moderate constipation.   traMADol 50 MG tablet Commonly known as:  ULTRAM Take 1 tablet (50 mg total) by mouth every 6 (six) hours as needed (mild pain).      Follow-up Information    Stark Klein, MD. Call.   Specialty:  General Surgery Why:  Our office is working on scheduling an appointment for you, please call to confirm appointment date and time. Please arrive 30 min prior to appointment time to check in. Bring photo ID and insurance information.  Contact information: [address] [place] [address] [phone]           Signed: Clovis Riley 09/30/2017, 9:45 AM

## 2017-09-30 NOTE — Progress Notes (Signed)
Reviewed AVS and discharge instructions with pt. She verbalized understanding and provided teach back. Questions were answered to her satisfaction. Pt discharged for home in stable condition with pain controlled for home via [facility] with all belongings and printed prescriptions.

## 2017-10-01 ENCOUNTER — Telehealth: Payer: Self-pay | Admitting: *Deleted

## 2017-10-01 NOTE — Telephone Encounter (Signed)
Pt was on TCM report admitted 09/27/17 for Acute appendicitis. Pt had a laparoscopic appendectomy for perforated appendicitis, and was D/C 09/30/17 will follow-up w/specialist Stark Klein in 10 days...Johny Chess

## 2017-11-01 ENCOUNTER — Other Ambulatory Visit: Payer: Self-pay | Admitting: Obstetrics and Gynecology

## 2017-11-01 DIAGNOSIS — Z1231 Encounter for screening mammogram for malignant neoplasm of breast: Secondary | ICD-10-CM

## 2017-11-12 DIAGNOSIS — L820 Inflamed seborrheic keratosis: Secondary | ICD-10-CM | POA: Diagnosis not present

## 2017-11-12 DIAGNOSIS — L821 Other seborrheic keratosis: Secondary | ICD-10-CM | POA: Diagnosis not present

## 2017-11-12 DIAGNOSIS — D2239 Melanocytic nevi of other parts of face: Secondary | ICD-10-CM | POA: Diagnosis not present

## 2017-11-12 DIAGNOSIS — L718 Other rosacea: Secondary | ICD-10-CM | POA: Diagnosis not present

## 2017-11-12 DIAGNOSIS — L570 Actinic keratosis: Secondary | ICD-10-CM | POA: Diagnosis not present

## 2017-11-12 DIAGNOSIS — D1801 Hemangioma of skin and subcutaneous tissue: Secondary | ICD-10-CM | POA: Diagnosis not present

## 2017-11-23 ENCOUNTER — Ambulatory Visit
Admission: RE | Admit: 2017-11-23 | Discharge: 2017-11-23 | Disposition: A | Discharge status: Home / Self Care | Payer: Medicare Other | Source: Ambulatory Visit | Attending: Obstetrics and Gynecology | Admitting: Obstetrics and Gynecology

## 2017-11-23 DIAGNOSIS — Z1231 Encounter for screening mammogram for malignant neoplasm of breast: Secondary | ICD-10-CM | POA: Diagnosis not present

## 2017-12-20 DIAGNOSIS — Z01419 Encounter for gynecological examination (general) (routine) without abnormal findings: Secondary | ICD-10-CM | POA: Diagnosis not present

## 2017-12-20 DIAGNOSIS — Z6829 Body mass index (BMI) 29.0-29.9, adult: Secondary | ICD-10-CM | POA: Diagnosis not present

## 2018-01-15 ENCOUNTER — Encounter: Payer: Self-pay | Admitting: Gastroenterology

## 2018-02-08 ENCOUNTER — Encounter: Payer: Self-pay | Admitting: Family

## 2018-02-08 ENCOUNTER — Ambulatory Visit (INDEPENDENT_AMBULATORY_CARE_PROVIDER_SITE_OTHER): Payer: Medicare Other | Admitting: Family

## 2018-02-08 ENCOUNTER — Other Ambulatory Visit: Payer: Self-pay | Admitting: Family

## 2018-02-08 ENCOUNTER — Other Ambulatory Visit (INDEPENDENT_AMBULATORY_CARE_PROVIDER_SITE_OTHER): Payer: Medicare Other

## 2018-02-08 VITALS — BP 104/76 | HR 94 | Temp 98.7°F | Ht 61.18 in | Wt 146.1 lb

## 2018-02-08 DIAGNOSIS — R197 Diarrhea, unspecified: Secondary | ICD-10-CM

## 2018-02-08 LAB — COMPREHENSIVE METABOLIC PANEL
ALT: 17 U/L (ref 0–35)
AST: 14 U/L (ref 0–37)
Albumin: 3.8 g/dL (ref 3.5–5.2)
Alkaline Phosphatase: 56 U/L (ref 39–117)
BUN: 10 mg/dL (ref 6–23)
CALCIUM: 8.8 mg/dL (ref 8.4–10.5)
CHLORIDE: 103 meq/L (ref 96–112)
CO2: 28 meq/L (ref 19–32)
Creatinine, Ser: 0.71 mg/dL (ref 0.40–1.20)
GFR: 85.77 mL/min (ref >60.00)
Glucose, Bld: 119 mg/dL — ABNORMAL HIGH (ref 70–99)
POTASSIUM: 2.9 meq/L — AB (ref 3.5–5.1)
Sodium: 140 mEq/L (ref 135–145)
Total Bilirubin: 0.4 mg/dL (ref 0.2–1.2)
Total Protein: 7 g/dL (ref 6.0–8.3)

## 2018-02-08 LAB — CBC WITH DIFFERENTIAL/PLATELET
BASOS PCT: 0.2 % (ref 0.0–3.0)
Basophils Absolute: 0 10*3/uL (ref 0.0–0.1)
EOS PCT: 1.3 % (ref 0.0–5.0)
Eosinophils Absolute: 0.1 10*3/uL (ref 0.0–0.7)
HEMATOCRIT: 38.1 % (ref 36.0–46.0)
HEMOGLOBIN: 12.9 g/dL (ref 12.0–15.0)
LYMPHS PCT: 15.7 % (ref 12.0–46.0)
Lymphs Abs: 1 10*3/uL (ref 0.7–4.0)
MCHC: 33.7 g/dL (ref 30.0–36.0)
MCV: 91.5 fl (ref 78.0–100.0)
MONOS PCT: 12.5 % — AB (ref 3.0–12.0)
Monocytes Absolute: 0.8 10*3/uL (ref 0.1–1.0)
NEUTROS ABS: 4.5 10*3/uL (ref 1.4–7.7)
Neutrophils Relative %: 70.3 % (ref 43.0–77.0)
PLATELETS: 330 10*3/uL (ref 150.0–400.0)
RBC: 4.17 Mil/uL (ref 3.87–5.11)
RDW: 13 % (ref 11.5–15.5)
WBC: 6.4 10*3/uL (ref 4.0–10.5)

## 2018-02-08 MED ORDER — METRONIDAZOLE 500 MG PO TABS: 500.0000 mg | ORAL_TABLET | Freq: Three times a day (TID) | ORAL | 0 refills | Status: DC

## 2018-02-08 MED ORDER — POTASSIUM CHLORIDE ER 10 MEQ PO TBCR: 10.0000 meq | EXTENDED_RELEASE_TABLET | Freq: Every day | ORAL | 0 refills | Status: DC

## 2018-02-08 NOTE — Patient Instructions (Signed)
Food Choices to Help Relieve Diarrhea, Adult  When you have diarrhea, the foods you eat and your eating habits are very important. Choosing the right foods and drinks can help:  · Relieve diarrhea.  · Replace lost fluids and nutrients.  · Prevent dehydration.    What general guidelines should I follow?  Relieving diarrhea  · Choose foods with less than 2 g or .07 oz. of fiber per serving.  · Limit fats to less than 8 tsp (38 g or 1.34 oz.) a day.  · Avoid the following:  ? Foods and beverages sweetened with high-fructose corn syrup, honey, or sugar alcohols such as xylitol, sorbitol, and mannitol.  ? Foods that contain a lot of fat or sugar.  ? Fried, greasy, or spicy foods.  ? High-fiber grains, breads, and cereals.  ? Raw fruits and vegetables.  · Eat foods that are rich in probiotics. These foods include dairy products such as yogurt and fermented milk products. They help increase healthy bacteria in the stomach and intestines (gastrointestinal tract, or GI tract).  · If you have lactose intolerance, avoid dairy products. These may make your diarrhea worse.  · Take medicine to help stop diarrhea (antidiarrheal medicine) only as told by your health care provider.  Replacing nutrients  · Eat small meals or snacks every 3–4 hours.  · Eat bland foods, such as white rice, toast, or baked potato, until your diarrhea starts to get better. Gradually reintroduce nutrient-rich foods as tolerated or as told by your health care provider. This includes:  ? Well-cooked protein foods.  ? Peeled, seeded, and soft-cooked fruits and vegetables.  ? Low-fat dairy products.  · Take vitamin and mineral supplements as told by your health care provider.  Preventing dehydration    · Start by sipping water or a special solution to prevent dehydration (oral rehydration solution, ORS). Urine that is clear or pale yellow means that you are getting enough fluid.  · Try to drink at least 8–10 cups of fluid each day to help replace lost  fluids.  · You may add other liquids in addition to water, such as clear juice or decaffeinated sports drinks, as tolerated or as told by your health care provider.  · Avoid drinks with caffeine, such as coffee, tea, or soft drinks.  · Avoid alcohol.  What foods are recommended?  The items listed may not be a complete list. Talk with your health care provider about what dietary choices are best for you.  Grains  White rice. White, French, or pita breads (fresh or toasted), including plain rolls, buns, or bagels. White pasta. Saltine, soda, or graham crackers. Pretzels. Low-fiber cereal. Cooked cereals made with water (such as cornmeal, farina, or cream cereals). Plain muffins. Matzo. Melba toast. Zwieback.  Vegetables  Potatoes (without the skin). Most well-cooked and canned vegetables without skins or seeds. Tender lettuce.  Fruits  Apple sauce. Fruits canned in juice. Cooked apricots, cherries, grapefruit, peaches, pears, or plums. Fresh bananas and cantaloupe.  Meats and other protein foods  Baked or boiled chicken. Eggs. Tofu. Fish. Seafood. Smooth nut butters. Ground or well-cooked tender beef, ham, veal, lamb, pork, or poultry.  Dairy  Plain yogurt, kefir, and unsweetened liquid yogurt. Lactose-free milk, buttermilk, skim milk, or soy milk. Low-fat or nonfat hard cheese.  Beverages  Water. Low-calorie sports drinks. Fruit juices without pulp. Strained tomato and vegetable juices. Decaffeinated teas. Sugar-free beverages not sweetened with sugar alcohols. Oral rehydration solutions, if approved by your health care   provider.  Seasoning and other foods  Bouillon, broth, or soups made from recommended foods.  What foods are not recommended?  The items listed may not be a complete list. Talk with your health care provider about what dietary choices are best for you.  Grains  Whole grain, whole wheat, bran, or rye breads, rolls, pastas, and crackers. Wild or brown rice. Whole grain or bran cereals. Barley. Oats  and oatmeal. Corn tortillas or taco shells. Granola. Popcorn.  Vegetables  Raw vegetables. Fried vegetables. Cabbage, broccoli, Brussels sprouts, artichokes, baked beans, beet greens, corn, kale, legumes, peas, sweet potatoes, and yams. Potato skins. Cooked spinach and cabbage.  Fruits  Dried fruit, including raisins and dates. Raw fruits. Stewed or dried prunes. Canned fruits with syrup.  Meat and other protein foods  Fried or fatty meats. Deli meats. Chunky nut butters. Nuts and seeds. Beans and lentils. Bacon. Hot dogs. Sausage.  Dairy  High-fat cheeses. Whole milk, chocolate milk, and beverages made with milk, such as milk shakes. Half-and-half. Cream. sour cream. Ice cream.  Beverages  Caffeinated beverages (such as coffee, tea, soda, or energy drinks). Alcoholic beverages. Fruit juices with pulp. Prune juice. Soft drinks sweetened with high-fructose corn syrup or sugar alcohols. High-calorie sports drinks.  Fats and oils  Butter. Cream sauces. Margarine. Salad oils. Plain salad dressings. Olives. Avocados. Mayonnaise.  Sweets and desserts  Sweet rolls, doughnuts, and sweet breads. Sugar-free desserts sweetened with sugar alcohols such as xylitol and sorbitol.  Seasoning and other foods  Honey. Hot sauce. Chili powder. Gravy. Cream-based or milk-based soups. Pancakes and waffles.  Summary  · When you have diarrhea, the foods you eat and your eating habits are very important.  · Make sure you get at least 8–10 cups of fluid each day, or enough to keep your urine clear or pale yellow.  · Eat bland foods and gradually reintroduce healthy, nutrient-rich foods as tolerated, or as told by your health care provider.  · Avoid high-fiber, fried, greasy, or spicy foods.  This information is not intended to replace advice given to you by your health care provider. Make sure you discuss any questions you have with your health care provider.  Document Released: 12/16/2003 Document Revised: 09/22/2016 Document  Reviewed: 09/22/2016  Elsevier Interactive Patient Education © 2018 Elsevier Inc.

## 2018-02-08 NOTE — Progress Notes (Signed)
Nancy Carr is a 72 y.o. female with the following history as recorded in Epic[facility]:  Patient Active Problem List   Diagnosis Date Noted  . Acute appendicitis 09/27/2017  . Generalized abdominal pain 09/26/2017  . Prediabetes 05/05/2017  . Elevated cholesterol 03/16/2017  . Hyperglycemia 08/28/2014  . Family history of premature coronary artery disease 08/12/2014  . Barrett's esophagus 01/10/2012  . Rosacea 03/02/2011  . Osteopenia 08/31/2009  . COLONIC POLYPS, HX OF 08/31/2009    Current Outpatient Medications  Medication Sig Dispense Refill  . Calcium Carb-Cholecalciferol (CALCIUM-VITAMIN D) 600-400 MG-UNIT TABS Take 1 tablet by mouth 2 (two) times daily.    Marland Kitchen omeprazole-sodium bicarbonate (ZEGERID) 40-1100 MG per capsule Take 1 capsule by mouth 2 (two) times daily. (Patient taking differently: Take 1 capsule by mouth daily. ) 180 capsule 4  . Bisacodyl (DULCOLAX PO) Take 2 tablets by mouth daily as needed (CONSTIPATION).    Marland Kitchen docusate sodium (COLACE) 100 MG capsule Take 1 capsule (100 mg total) by mouth 2 (two) times daily. (Patient not taking: Reported on 02/08/2018) 10 capsule 0  . ibuprofen (ADVIL,MOTRIN) 200 MG tablet Take 400 mg by mouth daily as needed for moderate pain.    . Ibuprofen-Diphenhydramine Cit (ADVIL PM PO) Take 1 tablet by mouth at bedtime as needed (SLEEP).    . metroNIDAZOLE (FLAGYL) 500 MG tablet Take 1 tablet (500 mg total) by mouth 3 (three) times daily. 21 tablet 0  . polyethylene glycol (MIRALAX / GLYCOLAX) packet Take 17 g by mouth daily as needed for moderate constipation.    . Sodium Phosphates (FLEET ENEMA RE) Place 1 enema rectally daily as needed (CONSTIPATION).    Marland Kitchen traMADol (ULTRAM) 50 MG tablet Take 1 tablet (50 mg total) by mouth every 6 (six) hours as needed (mild pain). (Patient not taking: Reported on 02/08/2018) 30 tablet 0   No current facility-administered medications for this visit.     Allergies: Patient has no known allergies.  Past  Medical History:  Diagnosis Date  . GERD (gastroesophageal reflux disease)    mild  . Hx of colonic polyps 2007   villous adenoma   . Osteoporosis   . Rosacea     Past Surgical History:  Procedure Laterality Date  . COLONOSCOPY    . COLONOSCOPY W/ POLYPECTOMY    . HYSTEROSCOPY    . LAPAROSCOPIC APPENDECTOMY N/A 09/27/2017   Procedure: APPENDECTOMY LAPAROSCOPIC;  Surgeon: Stark Klein, MD;  Location: [facility];  Service: General;  Laterality: N/A;  . POLYPECTOMY  2007   done by surgeon, Dr Dalbert Batman  . RECTAL POLYPECTOMY    . WISDOM TOOTH EXTRACTION      Family History  Problem Relation Age of Onset  . Uterine cancer Sister        clear cell  . Melanoma Sister   . Hypertension Sister   . Asthma Sister   . Coronary artery disease Father        also Alsheimer's; Parkinson's  . Coronary artery disease Paternal Grandfather        also CVA  . Heart attack Maternal Grandfather        < 55  . Stroke Paternal Grandmother        > 65; also Alsheimer's  . Dementia Mother        also Osteoporosis  . Irritable bowel syndrome Mother   . Heart attack Maternal Grandmother        < 65;also Alsheimer's  . Dementia Maternal Uncle   .  Colon cancer Neg Hx   . Stomach cancer Neg Hx   . Esophageal cancer Neg Hx   . Diabetes Neg Hx     Social History   Tobacco Use  . Smoking status: Never Smoker  . Smokeless tobacco: Never Used  Substance Use Topics  . Alcohol use: Yes    Alcohol/week: 4.2 oz    Types: 7 Glasses of wine per week    Subjective:  Patient started last Saturday with sudden onset of "bad diarrhea"; mild cramping/ low grade fever; has not seen any blood in diarrhea; "mostly water." Notes that has friend with similar symptoms; by Wednesday, seemed to normalize and she went out to eat; symptoms returned again that evening; no vomiting; is able to keep liquids down;   Objective:  Vitals:   02/08/18 1340  BP: 104/76  Pulse: 94  Temp: 98.7 F (37.1 C)  TempSrc: Oral   SpO2: 94%  Weight: 146 lb 1.3 oz (66.3 kg)  Height: 5' 1.18" (1.554 m)    General: Well developed, well nourished, in no acute distress  Skin : Warm and dry.  Head: Normocephalic and atraumatic  Lungs: Respirations unlabored; clear to auscultation bilaterally without wheeze, rales, rhonchi  CVS exam: normal rate and regular rhythm.  Abdomen: Soft; nontender; nondistended; normoactive bowel sounds; no masses or hepatosplenomegaly  Neurologic: Alert and oriented; speech intact; face symmetrical; moves all extremities well; CNII-XII intact without focal deficit  Assessment:  1. Diarrhea, unspecified type     Plan:  ? Infectious source; update CBC, CMP today; BRAT diet discussed; will go ahead and start Flagyl 500 mg tid x 7 days; follow-up to be determined- may need to consider stool cultures if symptoms persist.   No follow-ups on file.  Orders Placed This Encounter  Procedures  . CBC w/Diff    Standing Status:   Future    Number of Occurrences:   1    Standing Expiration Date:   02/08/2019  . Comp Met (CMET)    Standing Status:   Future    Number of Occurrences:   1    Standing Expiration Date:   02/08/2019    Requested Prescriptions   Signed Prescriptions Disp Refills  . metroNIDAZOLE (FLAGYL) 500 MG tablet 21 tablet 0    Sig: Take 1 tablet (500 mg total) by mouth 3 (three) times daily.

## 2018-02-11 ENCOUNTER — Other Ambulatory Visit: Payer: Self-pay | Admitting: Family

## 2018-02-11 DIAGNOSIS — E876 Hypokalemia: Secondary | ICD-10-CM

## 2018-02-11 NOTE — Progress Notes (Signed)
Spoke with patient and she said the medication worked and she is feeling much better.

## 2018-02-21 NOTE — Progress Notes (Addendum)
Subjective:   Nancy Carr is a 72 y.o. female who presents for an Initial Medicare Annual Wellness Visit.  Review of Systems    No ROS.  Medicare Wellness Visit. Additional risk factors are reflected in the social history.   Cardiac Risk Factors include: advanced age (>68men, >22 women) Sleep patterns: feels rested on waking, does not get up to void, gets up 1 times nightly to void and sleeps 7 hours nightly.    Home Safety/Smoke Alarms: Feels safe in home. Smoke alarms in place.  Living environment; residence and Firearm Safety: 1-story house/ trailer, no firearms.Lives alone, no needs for DME, good support system Seat Belt Safety/Bike Helmet: Wears seat belt.      Objective:    Today's Vitals   02/22/18 1430  BP: 118/72  Pulse: 84  Resp: 18  SpO2: 97%  Weight: 148 lb (67.1 kg)  Height: 5\' 1"  (1.549 m)   Body mass index is 27.96 kg/m.  Advanced Directives 02/22/2018 09/29/2017 09/27/2017 12/11/2016 11/13/2016  Does Patient Have a Medical Advance Directive? Yes - No Yes Yes  Type of Paramedic of [place];Living will - - [place];Living will [place];Living will  Copy of [place] in Chart? No - copy requested - - - -  Would patient like information on creating a medical advance directive? - No - Patient declined - - -    Current Medications (verified) Outpatient Encounter Medications as of 02/22/2018  Medication Sig  . Calcium Carb-Cholecalciferol (CALCIUM-VITAMIN D) 600-400 MG-UNIT TABS Take 1 tablet by mouth 2 (two) times daily.  . Ibuprofen-Diphenhydramine Cit (ADVIL PM PO) Take 1 tablet by mouth at bedtime as needed (SLEEP).  . metroNIDAZOLE (METROCREAM) 0.75 % cream   . omeprazole-sodium bicarbonate (ZEGERID) 40-1100 MG per capsule Take 1 capsule by mouth 2 (two) times daily. (Patient taking differently: Take 1 capsule by mouth daily. )  . [DISCONTINUED] Bisacodyl (DULCOLAX PO)  Take 2 tablets by mouth daily as needed (CONSTIPATION).  . [DISCONTINUED] docusate sodium (COLACE) 100 MG capsule Take 1 capsule (100 mg total) by mouth 2 (two) times daily. (Patient not taking: Reported on 02/08/2018)  . [DISCONTINUED] ibuprofen (ADVIL,MOTRIN) 200 MG tablet Take 400 mg by mouth daily as needed for moderate pain.  . [DISCONTINUED] metroNIDAZOLE (FLAGYL) 500 MG tablet Take 1 tablet (500 mg total) by mouth 3 (three) times daily. (Patient not taking: Reported on 02/22/2018)  . [DISCONTINUED] polyethylene glycol (MIRALAX / GLYCOLAX) packet Take 17 g by mouth daily as needed for moderate constipation.  . [DISCONTINUED] potassium chloride (K-DUR) 10 MEQ tablet Take 1 tablet (10 mEq total) by mouth daily. (Patient not taking: Reported on 02/22/2018)  . [DISCONTINUED] Sodium Phosphates (FLEET ENEMA RE) Place 1 enema rectally daily as needed (CONSTIPATION).  . [DISCONTINUED] traMADol (ULTRAM) 50 MG tablet Take 1 tablet (50 mg total) by mouth every 6 (six) hours as needed (mild pain). (Patient not taking: Reported on 02/08/2018)   No facility-administered encounter medications on file as of 02/22/2018.     Allergies (verified) Patient has no known allergies.   History: Past Medical History:  Diagnosis Date  . GERD (gastroesophageal reflux disease)    mild  . Hx of colonic polyps 2007   villous adenoma   . Osteoporosis   . Rosacea    Past Surgical History:  Procedure Laterality Date  . COLONOSCOPY    . COLONOSCOPY W/ POLYPECTOMY    . HYSTEROSCOPY    . LAPAROSCOPIC APPENDECTOMY  N/A 09/27/2017   Procedure: APPENDECTOMY LAPAROSCOPIC;  Surgeon: Stark Klein, MD;  Location: [facility];  Service: General;  Laterality: N/A;  . POLYPECTOMY  2007   done by surgeon, Dr Dalbert Batman  . RECTAL POLYPECTOMY    . WISDOM TOOTH EXTRACTION     Family History  Problem Relation Age of Onset  . Uterine cancer Sister        clear cell  . Melanoma Sister   . Hypertension Sister   . Asthma Sister   .  Coronary artery disease Father        also Alsheimer's; Parkinson's  . Coronary artery disease Paternal Grandfather        also CVA  . Heart attack Maternal Grandfather        < 55  . Stroke Paternal Grandmother        > 65; also Alsheimer's  . Dementia Mother        also Osteoporosis  . Irritable bowel syndrome Mother   . Heart attack Maternal Grandmother        < 65;also Alsheimer's  . Dementia Maternal Uncle   . Colon cancer Neg Hx   . Stomach cancer Neg Hx   . Esophageal cancer Neg Hx   . Diabetes Neg Hx    Social History   Socioeconomic History  . Marital status: Widowed    Spouse name: Not on file  . Number of children: 2  . Years of education: Not on file  . Highest education level: Not on file  Occupational History  . Occupation: Retired  Scientific laboratory technician  . Financial resource strain: Not hard at all  . Food insecurity:    Worry: Never true    Inability: Never true  . Transportation needs:    Medical: No    Non-medical: No  Tobacco Use  . Smoking status: Never Smoker  . Smokeless tobacco: Never Used  Substance and Sexual Activity  . Alcohol use: Yes    Alcohol/week: 4.2 oz    Types: 7 Glasses of Maxx [place] per week  . Drug use: No  . Sexual activity: Never  Lifestyle  . Physical activity:    Days per week: 5 days    Minutes per session: 50 min  . Stress: Not at all  Relationships  . Social connections:    Talks on phone: More than three times a week    Gets together: More than three times a week    Attends religious service: More than 4 times per year    Active member of club or organization: Yes    Attends meetings of clubs or organizations: More than 4 times per year    Relationship status: Widowed  Other Topics Concern  . Not on file  Social History Narrative  . Not on file    Tobacco Counseling Counseling given: Not Answered   Activities of Daily Living In your present state of health, do you have any difficulty performing the following  activities: 02/22/2018 09/27/2017  Hearing? N N  Vision? N N  Difficulty concentrating or making decisions? N N  Walking or climbing stairs? N N  Dressing or bathing? N N  Doing errands, shopping? N N  Preparing Food and eating ? N -  Using the Toilet? N -  In the past six months, have you accidently leaked urine? N -  Do you have problems with loss of bowel control? N -  Managing your Medications? N -  Managing your Finances? N -  Housekeeping or managing your Housekeeping? N -  Some recent data might be hidden     Immunizations and Health Maintenance Immunization History  Administered Date(s) Administered  . Influenza, High Dose Seasonal PF 08/03/2014  . Influenza-Unspecified 06/19/2017  . Pneumococcal Conjugate-13 09/08/2014  . Pneumococcal Polysaccharide-23 03/16/2017  . Td 10/09/2006   Health Maintenance Due  Topic Date Due  . DEXA SCAN  02/16/2010  . TETANUS/TDAP  10/09/2016    Patient Care Team: Binnie Rail, MD as PCP - General (Internal Medicine)  Indicate any recent Medical Services you may have received from other than [facility] providers in the past year (date may be approximate).     Assessment:   This is a routine wellness examination for Arshiya. Physical assessment deferred to PCP.   Hearing/Vision screen Hearing Screening Comments: Able to hear conversational tones w/o difficulty. No issues reported.  Passed whisper test Vision Screening Comments: appointment yearly Dr. Kathrin Penner  Dietary issues and exercise activities discussed: Current Exercise Habits: Home exercise routine, Type of exercise: walking, Time (Minutes): 50, Frequency (Times/Week): 6, Weekly Exercise (Minutes/Week): 300, Intensity: Moderate  Diet (meal preparation, eat out, water intake, caffeinated beverages, dairy products, fruits and vegetables): in general, a "healthy" diet  , well balanced, eats a variety of fruits and vegetables daily, limits salt, fat/cholesterol,  sugar,carbohydrates,caffeine, drinks 6-8 glasses of water daily.   Reviewed heart healthy and diabetic diet   Goals    . Patient Stated     Monitor my diet to prevent diabetes.       Depression Screen PHQ 2/9 Scores 02/22/2018 03/16/2017 11/18/2014  PHQ - 2 Score 0 0 0    Fall Risk Fall Risk  02/22/2018 03/16/2017 11/18/2014  Falls in the past year? No No No   Cognitive Function:       Ad8 score reviewed for issues:  Issues making decisions: no  Less interest in hobbies / activities: no  Repeats questions, stories (family complaining): no  Trouble using ordinary gadgets (microwave, computer, phone):no  Forgets the month or year: no  Mismanaging finances: no  Remembering appts: no  Daily problems with thinking and/or memory: no Ad8 score is= 0    Screening Tests Health Maintenance  Topic Date Due  . DEXA SCAN  02/16/2010  . TETANUS/TDAP  10/09/2016  . INFLUENZA VACCINE  05/09/2018  . MAMMOGRAM  11/24/2019  . Hepatitis C Screening  Completed  . PNA vac Low Risk Adult  Completed     Plan:    Continue doing brain stimulating activities (puzzles, reading, adult coloring books, staying active) to keep memory sharp.   Continue to eat heart healthy diet (full of fruits, vegetables, whole grains, lean protein, water--limit salt, fat, and sugar intake) and increase physical activity as tolerated.   I have personally reviewed and noted the following in the patient's chart:   . Medical and social history . Use of alcohol, tobacco or illicit drugs  . Current medications and supplements . Functional ability and status . Nutritional status . Physical activity . Advanced directives . List of other physicians . Vitals . Screenings to include cognitive, depression, and falls . Referrals and appointments  In addition, I have reviewed and discussed with patient certain preventive protocols, quality metrics, and best practice recommendations. A written personalized care  plan for preventive services as well as general preventive health recommendations were provided to patient.     Michiel Cowboy, RN   02/22/2018   Medical screening examination/treatment/procedure(s) were performed by non-physician  practitioner and as supervising physician I was immediately available for consultation/collaboration. I agree with above. Cathlean Cower, MD

## 2018-02-22 ENCOUNTER — Ambulatory Visit (INDEPENDENT_AMBULATORY_CARE_PROVIDER_SITE_OTHER): Payer: Medicare Other | Admitting: *Deleted

## 2018-02-22 ENCOUNTER — Telehealth: Payer: Self-pay | Admitting: *Deleted

## 2018-02-22 VITALS — BP 118/72 | HR 84 | Resp 18 | Ht 61.0 in | Wt 148.0 lb

## 2018-02-22 DIAGNOSIS — Z0000 Encounter for general adult medical examination without abnormal findings: Secondary | ICD-10-CM | POA: Diagnosis not present

## 2018-02-22 NOTE — Patient Instructions (Signed)
Continue doing brain stimulating activities (puzzles, reading, adult coloring books, staying active) to keep memory sharp.   Continue to eat heart healthy diet (full of fruits, vegetables, whole grains, lean protein, water--limit salt, fat, and sugar intake) and increase physical activity as tolerated.   Ms. Nancy Carr , Thank you for taking time to come for your Medicare Wellness Visit. I appreciate your ongoing commitment to your health goals. Please review the following plan we discussed and let me know if I can assist you in the future.   These are the goals we discussed: Goals    . Patient Stated     Monitor my diet to prevent diabetes.        This is a list of the screening recommended for you and due dates:  Health Maintenance  Topic Date Due  . DEXA scan (bone density measurement)  02/16/2010  . Tetanus Vaccine  10/09/2016  . Flu Shot  05/09/2018  . Mammogram  11/24/2019  .  Hepatitis C: One time screening is recommended by Center for Disease Control  (CDC) for  adults born from [birth year] through 1966.   Completed  . Pneumonia vaccines  Completed

## 2018-02-22 NOTE — Telephone Encounter (Signed)
During AWV, patient stated she would like to have a prescription for Scopolamine patches. She is going on a cruise in July and does not want to get motion sickness.

## 2018-02-23 MED ORDER — SCOPOLAMINE 1 MG/3DAYS TD PT72: 1.0000 | MEDICATED_PATCH | TRANSDERMAL | 0 refills | Status: DC

## 2018-02-23 NOTE — Telephone Encounter (Signed)
rx sent to local pharmacy

## 2018-02-25 ENCOUNTER — Telehealth: Payer: Self-pay | Admitting: Emergency Medicine

## 2018-02-25 NOTE — Telephone Encounter (Signed)
PA completes for Transderm patches. Key [id number]

## 2018-02-26 NOTE — Telephone Encounter (Signed)
Contacted [facility] to inform PA was approved through 09/2018

## 2018-03-22 DIAGNOSIS — H52203 Unspecified astigmatism, bilateral: Secondary | ICD-10-CM | POA: Diagnosis not present

## 2018-03-22 DIAGNOSIS — H43813 Vitreous degeneration, bilateral: Secondary | ICD-10-CM | POA: Diagnosis not present

## 2018-03-22 DIAGNOSIS — H25813 Combined forms of age-related cataract, bilateral: Secondary | ICD-10-CM | POA: Diagnosis not present

## 2018-03-22 DIAGNOSIS — H04123 Dry eye syndrome of bilateral lacrimal glands: Secondary | ICD-10-CM | POA: Diagnosis not present

## 2018-04-02 ENCOUNTER — Other Ambulatory Visit: Payer: Self-pay | Admitting: Internal Medicine

## 2018-04-02 NOTE — Telephone Encounter (Signed)
Copied from [place] [phone]. Topic: Quick Communication - See Telephone Encounter >> Apr 02, 2018  9:12 AM Conception Chancy, NT wrote: CRM for notification. See Telephone encounter for: 04/02/18.  Patient is calling and states that [facility] told her scopolamine (TRANSDERM-SCOP, 1.5 MG,) 1 MG/3DAYS [ was approved. She would like a refill.   [facility] [address] [place], [place] - [address] [place] [address] Phone: [phone] Fax: [phone]

## 2018-04-03 MED ORDER — SCOPOLAMINE 1 MG/3DAYS TD PT72: 1.0000 | MEDICATED_PATCH | TRANSDERMAL | 0 refills | Status: DC

## 2018-04-03 NOTE — Telephone Encounter (Signed)
Okay to refill? 

## 2018-04-09 DIAGNOSIS — C44719 Basal cell carcinoma of skin of left lower limb, including hip: Secondary | ICD-10-CM | POA: Diagnosis not present

## 2018-04-09 DIAGNOSIS — D485 Neoplasm of uncertain behavior of skin: Secondary | ICD-10-CM | POA: Diagnosis not present

## 2018-04-09 DIAGNOSIS — L821 Other seborrheic keratosis: Secondary | ICD-10-CM | POA: Diagnosis not present

## 2018-04-24 DIAGNOSIS — R21 Rash and other nonspecific skin eruption: Secondary | ICD-10-CM | POA: Diagnosis not present

## 2018-06-11 ENCOUNTER — Telehealth: Payer: Self-pay | Admitting: Gastroenterology

## 2018-06-11 ENCOUNTER — Ambulatory Visit: Payer: Self-pay

## 2018-06-11 ENCOUNTER — Other Ambulatory Visit (INDEPENDENT_AMBULATORY_CARE_PROVIDER_SITE_OTHER): Payer: Medicare Other

## 2018-06-11 ENCOUNTER — Encounter: Payer: Self-pay | Admitting: Internal Medicine

## 2018-06-11 ENCOUNTER — Ambulatory Visit (INDEPENDENT_AMBULATORY_CARE_PROVIDER_SITE_OTHER): Payer: Medicare Other | Admitting: Internal Medicine

## 2018-06-11 VITALS — BP 118/80 | HR 78 | Temp 98.4°F | Resp 16 | Ht 61.0 in | Wt 150.8 lb

## 2018-06-11 DIAGNOSIS — K625 Hemorrhage of anus and rectum: Secondary | ICD-10-CM

## 2018-06-11 DIAGNOSIS — L739 Follicular disorder, unspecified: Secondary | ICD-10-CM | POA: Insufficient documentation

## 2018-06-11 LAB — CBC WITH DIFFERENTIAL/PLATELET
BASOS ABS: 0 10*3/uL (ref 0.0–0.1)
Basophils Relative: 0.7 % (ref 0.0–3.0)
Eosinophils Absolute: 0.1 10*3/uL (ref 0.0–0.7)
Eosinophils Relative: 1.7 % (ref 0.0–5.0)
HCT: 41.9 % (ref 36.0–46.0)
HEMOGLOBIN: 14.1 g/dL (ref 12.0–15.0)
Lymphocytes Relative: 26.3 % (ref 12.0–46.0)
Lymphs Abs: 1.6 10*3/uL (ref 0.7–4.0)
MCHC: 33.7 g/dL (ref 30.0–36.0)
MCV: 93.3 fl (ref 78.0–100.0)
Monocytes Absolute: 0.5 10*3/uL (ref 0.1–1.0)
Monocytes Relative: 8.8 % (ref 3.0–12.0)
Neutro Abs: 3.8 10*3/uL (ref 1.4–7.7)
Neutrophils Relative %: 62.5 % (ref 43.0–77.0)
Platelets: 297 10*3/uL (ref 150.0–400.0)
RBC: 4.49 Mil/uL (ref 3.87–5.11)
RDW: 14.5 % (ref 11.5–15.5)
WBC: 6 10*3/uL (ref 4.0–10.5)

## 2018-06-11 LAB — COMPREHENSIVE METABOLIC PANEL
ALBUMIN: 4.2 g/dL (ref 3.5–5.2)
ALK PHOS: 52 U/L (ref 39–117)
ALT: 14 U/L (ref 0–35)
AST: 12 U/L (ref 0–37)
BILIRUBIN TOTAL: 0.3 mg/dL (ref 0.2–1.2)
BUN: 16 mg/dL (ref 6–23)
CO2: 28 mEq/L (ref 19–32)
Calcium: 9.4 mg/dL (ref 8.4–10.5)
Chloride: 106 mEq/L (ref 96–112)
Creatinine, Ser: 0.89 mg/dL (ref 0.40–1.20)
GFR: 66.02 mL/min (ref >60.00)
Glucose, Bld: 99 mg/dL (ref 70–99)
POTASSIUM: 4 meq/L (ref 3.5–5.1)
SODIUM: 142 meq/L (ref 135–145)
TOTAL PROTEIN: 7.1 g/dL (ref 6.0–8.3)

## 2018-06-11 NOTE — Assessment & Plan Note (Signed)
Findings in axilla suggestive of folliculitis - reassured this is not lymphadenopathy - no other concerning symptoms or signs Can hold off on shaving for a few days or hold deo Monitor and call /return if this does not improve/resolve or worsens

## 2018-06-11 NOTE — Assessment & Plan Note (Signed)
Rectal bleeding x 2 three days ago, w/o other symptoms Normal colonoscopy 3/18, except diverticulosis Bleed likely related to diverticulosis No significant hemorrhoids and never had hemorrhoidal bleeding Reassured her this is not likely cancer Will check cbc, cmp Has GI appt this month -- she will likely cancel this -- if bleeding recurs she will see GI for further evaluation Decreased nuts/fruits

## 2018-06-11 NOTE — Telephone Encounter (Signed)
Spoke to patient, she states that on Saturday she had one episode of bright red blood with a bm, had another episode that evening. None since. She is also concerned that she had a swollen lymph node under left arm about two weeks ago which resolved, she now has another area under her right arm. I have asked her to contact her PCP to see if they can get her in sooner to evaluate this. She does have an appointment to see APP on 9/16, advised to keep that appointment, call our office if rectal bleeding resumes.

## 2018-06-11 NOTE — Telephone Encounter (Signed)
Pt. Reports she saw blood x 2 in her stool this past Saturday.Has not seen any blood since. States had "small abdominal discomfort." Reports she also has "sore lymph nodes" under her arms. No other symptoms. Reports she as "diverticulitis, but has never had a flare up." Has hemorrhoids, but no issues. Appointment made for today with her provider.Instructed if she has worsening symptoms to call back.   Reason for Disposition . MILD rectal bleeding (more than just a few drops or streaks)  Answer Assessment - Initial Assessment Questions 1. APPEARANCE of BLOOD: "What color is it?" "Is it passed separately, on the surface of the stool, or mixed in with the stool?"      Bright red blood 2. AMOUNT: "How much blood was passed?"      Moderate 3. FREQUENCY: "How many times has blood been passed with the stools?"       2 x Saturday 4. ONSET: "When was the blood first seen in the stools?" (Days or weeks)      Just Saturday 5. DIARRHEA: "Is there also some diarrhea?" If so, ask: "How many diarrhea stools were passed in past 24 hours?"      No 6. CONSTIPATION: "Do you have constipation?" If so, "How bad is it?"     No 7. RECURRENT SYMPTOMS: "Have you had blood in your stools before?" If so, ask: "When was the last time?" and "What happened that time?"      Yes 8. BLOOD THINNERS: "Do you take any blood thinners?" (e.g., Coumadin/warfarin, Pradaxa/dabigatran, aspirin)     No 9. OTHER SYMPTOMS: "Do you have any other symptoms?"  (e.g., abdominal pain, vomiting, dizziness, fever)     Had slight abdominal discomfort 10. PREGNANCY: "Is there any chance you are pregnant?" "When was your last menstrual period?"       No  Protocols used: RECTAL BLEEDING-A-AH

## 2018-06-11 NOTE — Progress Notes (Signed)
Subjective:    Patient ID: Nancy Carr, female    DOB: 02/24/1946, 72 y.o.   MRN: [id number]  HPI The patient is here for an acute visit.  Blood in stool: 3 days ago she saw blood in her stool twice.  The blood was in the stool and around the stool.  The toilet water was also red.  She denies any rectal discmfort.  She may have had some abdominal cramping at one point, but is unsure if she was over thinking things-it was not severe if she did have any.  She has not seen any blood since then.  At this time she was having normal bowel movements and denies any constipation or diarrhea.  She did not strain.  She does have some hemorrhoids, but they never bother her.  She does have a history of diverticulosis, but has never had diverticulitis.  She has been eating a lot of fruit with small seeds and nuts.  She was concerned about the possibility of colon cancer.  She otherwise feels well.  She is an appointment with GI on 06/24/2018. Colonoscopy 12/11/16: Diverticulosis in left colon.  No polyps or biopsies taken.   Axillary lymphadenopathy:  The lymph node under her left arm was acting up for about one week - it was swollen and tender.  It is no longer tender.  She can still feel it a little.  Today the right axilla lymph nodes are tender.  Mammogram was done in February.  She denies any breast symptoms.  She was concerned the lymph nodes were related to the rectal bleeding.     Medications and allergies reviewed with patient and updated if appropriate.  Patient Active Problem List   Diagnosis Date Noted  . Generalized abdominal pain 09/26/2017  . Prediabetes 05/05/2017  . Elevated cholesterol 03/16/2017  . Hyperglycemia 08/28/2014  . Family history of premature coronary artery disease 08/12/2014  . Barrett's esophagus 01/10/2012  . Rosacea 03/02/2011  . Osteopenia 08/31/2009  . COLONIC POLYPS, HX OF 08/31/2009    Current Outpatient Medications on File Prior to Visit  Medication  Sig Dispense Refill  . Calcium Carb-Cholecalciferol (CALCIUM-VITAMIN D) 600-400 MG-UNIT TABS Take 1 tablet by mouth 2 (two) times daily.    . Ibuprofen-Diphenhydramine Cit (ADVIL PM PO) Take 1 tablet by mouth at bedtime as needed (SLEEP).    . metroNIDAZOLE (METROCREAM) 0.75 % cream     . omeprazole-sodium bicarbonate (ZEGERID) 40-1100 MG per capsule Take 1 capsule by mouth 2 (two) times daily. (Patient taking differently: Take 1 capsule by mouth daily. ) 180 capsule 4   No current facility-administered medications on file prior to visit.     Past Medical History:  Diagnosis Date  . GERD (gastroesophageal reflux disease)    mild  . Hx of colonic polyps 2007   villous adenoma   . Osteoporosis   . Rosacea     Past Surgical History:  Procedure Laterality Date  . COLONOSCOPY    . COLONOSCOPY W/ POLYPECTOMY    . HYSTEROSCOPY    . LAPAROSCOPIC APPENDECTOMY N/A 09/27/2017   Procedure: APPENDECTOMY LAPAROSCOPIC;  Surgeon: Stark Klein, MD;  Location: [facility];  Service: General;  Laterality: N/A;  . POLYPECTOMY  2007   done by surgeon, Dr Dalbert Batman  . RECTAL POLYPECTOMY    . WISDOM TOOTH EXTRACTION      Social History   Socioeconomic History  . Marital status: Widowed    Spouse name: Not on file  . Number  of children: 2  . Years of education: Not on file  . Highest education level: Not on file  Occupational History  . Occupation: Retired  Scientific laboratory technician  . Financial resource strain: Not hard at all  . Food insecurity:    Worry: Never true    Inability: Never true  . Transportation needs:    Medical: No    Non-medical: No  Tobacco Use  . Smoking status: Never Smoker  . Smokeless tobacco: Never Used  Substance and Sexual Activity  . Alcohol use: Yes    Alcohol/week: 7.0 standard drinks    Types: 7 Glasses of wine per week  . Drug use: No  . Sexual activity: Never  Lifestyle  . Physical activity:    Days per week: 5 days    Minutes per session: 50 min  . Stress: Not at  all  Relationships  . Social connections:    Talks on phone: More than three times a week    Gets together: More than three times a week    Attends religious service: More than 4 times per year    Active member of club or organization: Yes    Attends meetings of clubs or organizations: More than 4 times per year    Relationship status: Widowed  Other Topics Concern  . Not on file  Social History Narrative  . Not on file    Family History  Problem Relation Age of Onset  . Uterine cancer Sister        clear cell  . Melanoma Sister   . Hypertension Sister   . Asthma Sister   . Coronary artery disease Father        also Alsheimer's; Parkinson's  . Coronary artery disease Paternal Grandfather        also CVA  . Heart attack Maternal Grandfather        < 55  . Stroke Paternal Grandmother        > 65; also Alsheimer's  . Dementia Mother        also Osteoporosis  . Irritable bowel syndrome Mother   . Heart attack Maternal Grandmother        < 65;also Alsheimer's  . Dementia Maternal Uncle   . Colon cancer Neg Hx   . Stomach cancer Neg Hx   . Esophageal cancer Neg Hx   . Diabetes Neg Hx     Review of Systems  Constitutional: Negative for chills, fatigue and fever.  HENT: Negative for congestion, ear pain, sinus pain and sore throat.   Respiratory: Negative for cough, shortness of breath and wheezing.   Cardiovascular: Negative for chest pain, palpitations and leg swelling.  Gastrointestinal: Positive for blood in stool. Negative for abdominal pain (maybe a little cramping saturday when she had he bleeding), constipation, diarrhea, nausea, rectal pain and vomiting.       Atypical gerd  Genitourinary: Negative for dysuria, frequency and hematuria.  Musculoskeletal: Positive for back pain (left lower back pain).  Hematological: Positive for adenopathy (axilla).       Objective:   Vitals:   06/11/18 1528  BP: 118/80  Pulse: 78  Resp: 16  Temp: 98.4 F (36.9 C)    SpO2: 96%   BP Readings from Last 3 Encounters:  06/11/18 118/80  02/22/18 118/72  02/08/18 104/76   Wt Readings from Last 3 Encounters:  06/11/18 150 lb 12.8 oz (68.4 kg)  02/22/18 148 lb (67.1 kg)  02/08/18 146 lb 1.3 oz (66.3 kg)  Body mass index is 28.49 kg/m.   Physical Exam  Constitutional: She appears well-developed and well-nourished. No distress.  HENT:  Head: Normocephalic and atraumatic.  Eyes: Conjunctivae are normal.  Neck: Normal range of motion. No tracheal deviation present. No thyromegaly present.  No occipital, auricular,mandibular lymphadenopathy  Abdominal: Soft. She exhibits no distension and no mass. There is no tenderness. There is no rebound and no guarding.  No inguinal lympadenopathy  Musculoskeletal: She exhibits no edema.  Lymphadenopathy:    She has no cervical adenopathy.  Skin: Skin is warm and dry. No rash noted. She is not diaphoretic. No erythema.  B/l armpits with superficial inflamed hair follicles - - n right axilla, 1 in left axilla with mild overlying erythema           Assessment & Plan:    See Problem List for Assessment and Plan of chronic medical problems.

## 2018-06-11 NOTE — Telephone Encounter (Signed)
noted 

## 2018-06-11 NOTE — Patient Instructions (Signed)
Have blood work today.    If you continue to have bleeding - you should see GI.    If your sore lumps in your armpits do not go away completely please let me know.     Rectal Bleeding Rectal bleeding is when blood passes out of the anus. People with rectal bleeding may notice bright red blood in their underwear or in the toilet after having a bowel movement. They may also have dark red or black stools. Rectal bleeding is usually a sign that something is wrong. Many things can cause rectal bleeding, including:  Hemorrhoids. These are blood vessels in the anus or rectum that are larger than normal.  Fistulas. These are abnormal passages in the rectum and anus.  Anal fissures. This is a tear in the anus.  Diverticulosis. This is a condition in which pockets or sacs project from the bowel.  Proctitis and colitis. These are conditions in which the rectum, colon, or anus become inflamed.  Polyps. These are growths that can be cancerous (malignant) or non-cancerous (benign).  Part of the rectum sticking out from the anus (rectal prolapse).  Certain medicines.  Intestinal infections.  Follow these instructions at home: Pay attention to any changes in your symptoms. Take these actions to help lessen bleeding and discomfort:  Eat a diet that is high in fiber. This will keep your stool soft, making it easier to pass stools without straining. Ask your health care provider what foods and drinks are high in fiber.  Drink enough fluid to keep your urine clear or pale yellow. This also helps to keep your stool soft.  Try taking a warm bath. This may help soothe any pain in your rectum.  Keep all follow-up visits as told by your health care provider. This is important.  Get help right away if:  You have new or increased rectal bleeding.  You have black or dark red stools.  You vomit blood or something that looks like coffee grounds.  You have pain or tenderness in your  abdomen.  You have a fever.  You feel weak.  You feel nauseous.  You faint.  You have severe pain in your rectum.  You cannot have a bowel movement. This information is not intended to replace advice given to you by your health care provider. Make sure you discuss any questions you have with your health care provider. Document Released: 03/17/2002 Document Revised: 03/02/2016 Document Reviewed: 11/21/2015 Elsevier Interactive Patient Education  [employer].

## 2018-06-12 ENCOUNTER — Encounter: Payer: Self-pay | Admitting: Internal Medicine

## 2018-06-24 ENCOUNTER — Ambulatory Visit: Payer: Medicare Other | Admitting: Nurse Practitioner

## 2018-07-26 DIAGNOSIS — Z23 Encounter for immunization: Secondary | ICD-10-CM | POA: Diagnosis not present

## 2018-10-25 ENCOUNTER — Other Ambulatory Visit: Payer: Self-pay | Admitting: Obstetrics and Gynecology

## 2018-10-25 DIAGNOSIS — Z1231 Encounter for screening mammogram for malignant neoplasm of breast: Secondary | ICD-10-CM

## 2018-11-11 DIAGNOSIS — L565 Disseminated superficial actinic porokeratosis (DSAP): Secondary | ICD-10-CM | POA: Diagnosis not present

## 2018-11-11 DIAGNOSIS — D225 Melanocytic nevi of trunk: Secondary | ICD-10-CM | POA: Diagnosis not present

## 2018-11-11 DIAGNOSIS — L821 Other seborrheic keratosis: Secondary | ICD-10-CM | POA: Diagnosis not present

## 2018-11-27 ENCOUNTER — Ambulatory Visit
Admission: RE | Admit: 2018-11-27 | Discharge: 2018-11-27 | Disposition: A | Discharge status: Home / Self Care | Payer: Medicare Other | Source: Ambulatory Visit | Attending: Obstetrics and Gynecology | Admitting: Obstetrics and Gynecology

## 2018-11-27 DIAGNOSIS — Z1231 Encounter for screening mammogram for malignant neoplasm of breast: Secondary | ICD-10-CM

## 2019-05-08 ENCOUNTER — Other Ambulatory Visit: Payer: Self-pay

## 2019-06-20 DIAGNOSIS — Z23 Encounter for immunization: Secondary | ICD-10-CM | POA: Diagnosis not present

## 2019-08-12 DIAGNOSIS — Z6829 Body mass index (BMI) 29.0-29.9, adult: Secondary | ICD-10-CM | POA: Diagnosis not present

## 2019-08-12 DIAGNOSIS — Z01419 Encounter for gynecological examination (general) (routine) without abnormal findings: Secondary | ICD-10-CM | POA: Diagnosis not present

## 2019-09-03 DIAGNOSIS — J029 Acute pharyngitis, unspecified: Secondary | ICD-10-CM | POA: Diagnosis not present

## 2019-10-15 ENCOUNTER — Other Ambulatory Visit: Payer: Self-pay | Admitting: Obstetrics and Gynecology

## 2019-10-15 DIAGNOSIS — Z1231 Encounter for screening mammogram for malignant neoplasm of breast: Secondary | ICD-10-CM

## 2019-10-29 DIAGNOSIS — M818 Other osteoporosis without current pathological fracture: Secondary | ICD-10-CM | POA: Diagnosis not present

## 2019-10-29 DIAGNOSIS — M810 Age-related osteoporosis without current pathological fracture: Secondary | ICD-10-CM | POA: Diagnosis not present

## 2019-11-02 ENCOUNTER — Ambulatory Visit: Payer: Medicare Other | Attending: Internal Medicine

## 2019-11-02 DIAGNOSIS — Z23 Encounter for immunization: Secondary | ICD-10-CM | POA: Insufficient documentation

## 2019-11-02 NOTE — Progress Notes (Signed)
   Covid-19 Vaccination Clinic  Name:  Nancy Carr    MRN: [id number] DOB: Jun 19, 1946  11/02/2019  [name] was observed post Covid-19 immunization for 15 minutes without incidence. She was provided with Vaccine Information Sheet and instruction to access the V-Safe system.   [name] was instructed to call 911 with any severe reactions post vaccine: Marland Kitchen Difficulty breathing  . Swelling of your face and throat  . A fast heartbeat  . A bad rash all over your body  . Dizziness and weakness    Immunizations Administered    Name Date Dose VIS Date Route   Pfizer COVID-19 Vaccine 11/02/2019 10:00 AM 0.3 mL 09/19/2019 Intramuscular   Manufacturer: [place]   Lot: [id number]   [place]: [id number]

## 2019-11-06 DIAGNOSIS — H43813 Vitreous degeneration, bilateral: Secondary | ICD-10-CM | POA: Diagnosis not present

## 2019-11-06 DIAGNOSIS — H5213 Myopia, bilateral: Secondary | ICD-10-CM | POA: Diagnosis not present

## 2019-11-06 DIAGNOSIS — H25813 Combined forms of age-related cataract, bilateral: Secondary | ICD-10-CM | POA: Diagnosis not present

## 2019-11-06 DIAGNOSIS — H52203 Unspecified astigmatism, bilateral: Secondary | ICD-10-CM | POA: Diagnosis not present

## 2019-11-11 DIAGNOSIS — M818 Other osteoporosis without current pathological fracture: Secondary | ICD-10-CM | POA: Diagnosis not present

## 2019-11-24 ENCOUNTER — Ambulatory Visit: Payer: Medicare Other | Attending: Internal Medicine

## 2019-11-24 DIAGNOSIS — Z23 Encounter for immunization: Secondary | ICD-10-CM

## 2019-11-24 NOTE — Progress Notes (Signed)
   Covid-19 Vaccination Clinic  Name:  Nancy Carr    MRN: [id number] DOB: April 17, 1946  11/24/2019  Nancy Carr was observed post Covid-19 immunization for 15 minutes without incidence. She was provided with Vaccine Information Sheet and instruction to access the V-Safe system.   Nancy Carr was instructed to call 911 with any severe reactions post vaccine: Marland Kitchen Difficulty breathing  . Swelling of your face and throat  . A fast heartbeat  . A bad rash all over your body  . Dizziness and weakness    Immunizations Administered    Name Date Dose VIS Date Route   Pfizer COVID-19 Vaccine 11/24/2019  8:20 AM 0.3 mL 09/19/2019 Intramuscular   Manufacturer: [place]   Lot: [id number]   [place]: [id number]

## 2019-11-27 ENCOUNTER — Ambulatory Visit: Payer: Medicare Other

## 2019-12-01 ENCOUNTER — Other Ambulatory Visit: Payer: Self-pay

## 2019-12-01 ENCOUNTER — Ambulatory Visit
Admission: RE | Admit: 2019-12-01 | Discharge: 2019-12-01 | Disposition: A | Discharge status: Home / Self Care | Payer: Medicare Other | Source: Ambulatory Visit | Attending: Obstetrics and Gynecology | Admitting: Obstetrics and Gynecology

## 2019-12-01 DIAGNOSIS — Z1231 Encounter for screening mammogram for malignant neoplasm of breast: Secondary | ICD-10-CM | POA: Diagnosis not present

## 2019-12-22 DIAGNOSIS — M816 Localized osteoporosis [Lequesne]: Secondary | ICD-10-CM | POA: Diagnosis not present

## 2019-12-22 DIAGNOSIS — N958 Other specified menopausal and perimenopausal disorders: Secondary | ICD-10-CM | POA: Diagnosis not present

## 2020-01-14 NOTE — Progress Notes (Signed)
Subjective:    Patient ID: Nancy Carr, female    DOB: 1945/12/15, 74 y.o.   MRN: [id number]  HPI The patient is here for an acute visit.  Approximately 1 week ago she was bit by a tick.  She was out weeding and thinks the tick probably crawled up her pants and bit her on the posterior right knee.  She did not notice the tick for couple of days.  She did get the tick off, but she is not sure if she got the head.  There was a small knot behind her knee where the tape better.  In the past couple of days it has gotten much redder and expanded.  There is also to bumps that look like blisters that are white in color.  The area is very sore.  She has not had any fevers or chills.  She denies any unusual headaches, body aches, rashes or fatigue.  She does not think the area looks like a bull's-eye.  She did bring the tick with her.    Medications and allergies reviewed with patient and updated if appropriate.  Patient Active Problem List   Diagnosis Date Noted  . Rectal bleeding 06/11/2018  . Folliculitis of both axillae 06/11/2018  . Generalized abdominal pain 09/26/2017  . Prediabetes 05/05/2017  . Elevated cholesterol 03/16/2017  . Hyperglycemia 08/28/2014  . Family history of premature coronary artery disease 08/12/2014  . Barrett's esophagus 01/10/2012  . Rosacea 03/02/2011  . Osteopenia 08/31/2009  . COLONIC POLYPS, HX OF 08/31/2009    Current Outpatient Medications on File Prior to Visit  Medication Sig Dispense Refill  . Calcium Carb-Cholecalciferol (CALCIUM-VITAMIN D) 600-400 MG-UNIT TABS Take 1 tablet by mouth 2 (two) times daily.    . Ibuprofen-Diphenhydramine Cit (ADVIL PM PO) Take 1 tablet by mouth at bedtime as needed (SLEEP).    . metroNIDAZOLE (METROCREAM) 0.75 % cream     . omeprazole-sodium bicarbonate (ZEGERID) 40-1100 MG per capsule Take 1 capsule by mouth 2 (two) times daily. (Patient taking differently: Take 1 capsule by mouth daily. ) 180 capsule 4    No current facility-administered medications on file prior to visit.    Past Medical History:  Diagnosis Date  . GERD (gastroesophageal reflux disease)    mild  . Hx of colonic polyps 2007   villous adenoma   . Osteoporosis   . Rosacea     Past Surgical History:  Procedure Laterality Date  . COLONOSCOPY    . COLONOSCOPY W/ POLYPECTOMY    . HYSTEROSCOPY    . LAPAROSCOPIC APPENDECTOMY N/A 09/27/2017   Procedure: APPENDECTOMY LAPAROSCOPIC;  Surgeon: Stark Klein, MD;  Location: [facility];  Service: General;  Laterality: N/A;  . POLYPECTOMY  2007   done by surgeon, Dr Dalbert Batman  . RECTAL POLYPECTOMY    . WISDOM TOOTH EXTRACTION      Social History   Socioeconomic History  . Marital status: Widowed    Spouse name: Not on file  . Number of children: 2  . Years of education: Not on file  . Highest education level: Not on file  Occupational History  . Occupation: Retired  Tobacco Use  . Smoking status: Never Smoker  . Smokeless tobacco: Never Used  Substance and Sexual Activity  . Alcohol use: Yes    Alcohol/week: 7.0 standard drinks    Types: 7 Glasses of wine per week  . Drug use: No  . Sexual activity: Never  Other Topics Concern  . Not  on file  Social History Narrative  . Not on file   Social Determinants of Health   Financial Resource Strain:   . Difficulty of Paying Living Expenses:   Food Insecurity:   . Worried About Charity fundraiser in the Last Year:   . Arboriculturist in the Last Year:   Transportation Needs:   . Film/video editor (Medical):   Marland Kitchen Lack of Transportation (Non-Medical):   Physical Activity:   . Days of Exercise per Week:   . Minutes of Exercise per Session:   Stress:   . Feeling of Stress :   Social Connections:   . Frequency of Communication with Friends and Family:   . Frequency of Social Gatherings with Friends and Family:   . Attends Religious Services:   . Active Member of Clubs or Organizations:   . Attends Theatre manager Meetings:   Marland Kitchen Marital Status:     Family History  Problem Relation Age of Onset  . Uterine cancer Sister        clear cell  . Melanoma Sister   . Hypertension Sister   . Asthma Sister   . Coronary artery disease Father        also Alsheimer's; Parkinson's  . Coronary artery disease Paternal Grandfather        also CVA  . Heart attack Maternal Grandfather        < 55  . Stroke Paternal Grandmother        > 65; also Alsheimer's  . Dementia Mother        also Osteoporosis  . Irritable bowel syndrome Mother   . Heart attack Maternal Grandmother        < 65;also Alsheimer's  . Dementia Maternal Uncle   . Colon cancer Neg Hx   . Stomach cancer Neg Hx   . Esophageal cancer Neg Hx   . Diabetes Neg Hx   . Breast cancer Neg Hx     Review of Systems  Constitutional: Negative for chills, fatigue and fever.  Cardiovascular: Negative for leg swelling.  Musculoskeletal:       No unusual joint pain or muscle pain  Skin: Positive for color change and wound. Negative for rash.  Neurological: Negative for numbness and headaches.       Objective:   Vitals:   01/15/20 0927  BP: 136/72  Pulse: 95  Resp: 16  Temp: 98.8 F (37.1 C)  SpO2: 99%   BP Readings from Last 3 Encounters:  01/15/20 136/72  06/11/18 118/80  02/22/18 118/72   Wt Readings from Last 3 Encounters:  01/15/20 153 lb (69.4 kg)  06/11/18 150 lb 12.8 oz (68.4 kg)  02/22/18 148 lb (67.1 kg)   Body mass index is 28.91 kg/m.   Physical Exam Constitutional:      General: She is not in acute distress.    Appearance: Normal appearance. She is not ill-appearing.  HENT:     Head: Normocephalic and atraumatic.  Skin:    General: Skin is warm and dry.     Comments: Right posterior knee area of erythema approximately the size of an orange in the center there are 2 white fluid-filled bumps surrounded by induration and smaller lumps that are firm.  Area is warm and tender with palpation.  Mild swelling   Neurological:     Mental Status: She is alert.        Procedure:  I & D Indications: Abscess/blister  Procedure  Details Consent:  Verbal consent for procedure obtained. Performed.  The area was cleaned with iodine and alcohol swabs.    A small area of the abscess was sprayed with a numbing spray.  A scalpel was used to make a small incision in the 2 fluid-filled blisters and with pressure a small amount of serous fluid was extracted.  There was no bleeding or pus.    A sterile dressing was applied.  Patient tolerated the procedure well.    Assessment & Plan:    See Problem List for Assessment and Plan of chronic medical problems.    This visit occurred during the SARS-CoV-2 public health emergency.  Safety protocols were in place, including screening questions prior to the visit, additional usage of staff PPE, and extensive cleaning of exam room while observing appropriate contact time as indicated for disinfecting solutions.

## 2020-01-15 ENCOUNTER — Other Ambulatory Visit: Payer: Self-pay

## 2020-01-15 ENCOUNTER — Encounter: Payer: Self-pay | Admitting: Internal Medicine

## 2020-01-15 ENCOUNTER — Ambulatory Visit (INDEPENDENT_AMBULATORY_CARE_PROVIDER_SITE_OTHER): Payer: Medicare Other | Admitting: Internal Medicine

## 2020-01-15 DIAGNOSIS — L03115 Cellulitis of right lower limb: Secondary | ICD-10-CM | POA: Diagnosis not present

## 2020-01-15 DIAGNOSIS — S80861A Insect bite (nonvenomous), right lower leg, initial encounter: Secondary | ICD-10-CM | POA: Insufficient documentation

## 2020-01-15 DIAGNOSIS — W57XXXA Bitten or stung by nonvenomous insect and other nonvenomous arthropods, initial encounter: Secondary | ICD-10-CM | POA: Insufficient documentation

## 2020-01-15 MED ORDER — DOXYCYCLINE HYCLATE 100 MG PO TABS: 100.0000 mg | ORAL_TABLET | Freq: Two times a day (BID) | ORAL | 0 refills | Status: DC

## 2020-01-15 NOTE — Assessment & Plan Note (Signed)
Acute Posterior right knee, secondary to tick bite Blisters drained-serous fluid only, no pus or blood Doxycycline twice daily x2 weeks since this was related to a tick bite Advised her to monitor the area closely and call if her symptoms do not improve or resolve completely

## 2020-01-15 NOTE — Patient Instructions (Signed)
  Medications reviewed and updated.  Changes include :   Doxycycline x 2 weeks  Your prescription(s) have been submitted to your pharmacy. Please take as directed and contact our office if you believe you are having problem(s) with the medication(s).  Please call if there is no improvement in your symptoms.

## 2020-01-15 NOTE — Assessment & Plan Note (Signed)
Acute Occurred about 1 week ago-took with attached for over a day Cellulitis, possible abscess Cellulitis does not look like a typical target lesion Cellulitis will be treated with doxycycline Discussed monitoring for tick bites and symptoms of Lyme disease and other tickborne diseases

## 2020-05-10 DIAGNOSIS — M810 Age-related osteoporosis without current pathological fracture: Secondary | ICD-10-CM | POA: Diagnosis not present

## 2020-06-28 DIAGNOSIS — H16103 Unspecified superficial keratitis, bilateral: Secondary | ICD-10-CM | POA: Diagnosis not present

## 2020-06-28 DIAGNOSIS — H04123 Dry eye syndrome of bilateral lacrimal glands: Secondary | ICD-10-CM | POA: Diagnosis not present

## 2020-06-28 DIAGNOSIS — H25813 Combined forms of age-related cataract, bilateral: Secondary | ICD-10-CM | POA: Diagnosis not present

## 2020-06-28 DIAGNOSIS — H43813 Vitreous degeneration, bilateral: Secondary | ICD-10-CM | POA: Diagnosis not present

## 2020-07-05 DIAGNOSIS — Z23 Encounter for immunization: Secondary | ICD-10-CM | POA: Diagnosis not present

## 2020-07-12 DIAGNOSIS — M810 Age-related osteoporosis without current pathological fracture: Secondary | ICD-10-CM | POA: Diagnosis not present

## 2020-08-09 DIAGNOSIS — Z23 Encounter for immunization: Secondary | ICD-10-CM | POA: Diagnosis not present

## 2020-09-20 DIAGNOSIS — Z6830 Body mass index (BMI) 30.0-30.9, adult: Secondary | ICD-10-CM | POA: Diagnosis not present

## 2020-09-20 DIAGNOSIS — Z124 Encounter for screening for malignant neoplasm of cervix: Secondary | ICD-10-CM | POA: Diagnosis not present

## 2020-11-02 ENCOUNTER — Other Ambulatory Visit: Payer: Self-pay | Admitting: Obstetrics and Gynecology

## 2020-11-02 DIAGNOSIS — Z1231 Encounter for screening mammogram for malignant neoplasm of breast: Secondary | ICD-10-CM

## 2020-12-20 ENCOUNTER — Ambulatory Visit
Admission: RE | Admit: 2020-12-20 | Discharge: 2020-12-20 | Disposition: A | Discharge status: Home / Self Care | Payer: Medicare Other | Source: Ambulatory Visit | Attending: Obstetrics and Gynecology | Admitting: Obstetrics and Gynecology

## 2020-12-20 ENCOUNTER — Other Ambulatory Visit: Payer: Self-pay

## 2020-12-20 DIAGNOSIS — Z1231 Encounter for screening mammogram for malignant neoplasm of breast: Secondary | ICD-10-CM | POA: Diagnosis not present

## 2021-01-10 DIAGNOSIS — M810 Age-related osteoporosis without current pathological fracture: Secondary | ICD-10-CM | POA: Diagnosis not present

## 2021-02-14 NOTE — Progress Notes (Signed)
Virtual Visit via telephone Note  I connected with Nancy Carr on 02/14/21 at  1:40 PM EDT by telephone and verified that I am speaking with the correct person using two identifiers.   I discussed the limitations of evaluation and management by telemedicine and the availability of in person appointments. The patient expressed understanding and agreed to proceed.  Present for the visit:  Myself, Dr Billey Gosling, Barrett Shell.  The patient is currently at home and I am in the office.    No referring provider.    History of Present Illness: This visit is an acute visit for being covid positive.    covid test positive yesterday morning - Monday.  Sunday, two days ago her test was negative.    Symptoms started Saturday, 3 days ago.  She was exposed on Wednesday  She states fatigue, mild nasal congestion, neck pain, back pain and headaches.    Has not needed to take anything and does not want to mask her symptoms.   She did have her booster.     Review of Systems  Constitutional: Positive for malaise/fatigue. Negative for chills and fever.  HENT: Positive for congestion (mild). Negative for ear pain, sinus pain and sore throat.   Respiratory: Negative for cough, shortness of breath and wheezing.   Gastrointestinal: Negative for abdominal pain, diarrhea and nausea.  Musculoskeletal: Positive for back pain (mild near kidneys) and neck pain.  Neurological: Positive for headaches. Negative for dizziness.      Social History   Socioeconomic History  . Marital status: Widowed    Spouse name: Not on file  . Number of children: 2  . Years of education: Not on file  . Highest education level: Not on file  Occupational History  . Occupation: Retired  Tobacco Use  . Smoking status: Never Smoker  . Smokeless tobacco: Never Used  Vaping Use  . Vaping Use: Never used  Substance and Sexual Activity  . Alcohol use: Yes    Alcohol/week: 7.0 standard drinks    Types: 7  Glasses of wine per week  . Drug use: No  . Sexual activity: Never  Other Topics Concern  . Not on file  Social History Narrative  . Not on file   Social Determinants of Health   Financial Resource Strain: Not on file  Food Insecurity: Not on file  Transportation Needs: Not on file  Physical Activity: Not on file  Stress: Not on file  Social Connections: Not on file     Observations/Objective:    Assessment and Plan:  See Problem List for Assessment and Plan of chronic medical problems.   Follow Up Instructions:    I discussed the assessment and treatment plan with the patient. The patient was provided an opportunity to ask questions and all were answered. The patient agreed with the plan and demonstrated an understanding of the instructions.   The patient was advised to call back or seek an in-person evaluation if the symptoms worsen or if the condition fails to improve as anticipated.  Time spent on telephone call - 15 minutes  Binnie Rail, MD

## 2021-02-15 ENCOUNTER — Other Ambulatory Visit: Payer: Self-pay

## 2021-02-15 ENCOUNTER — Telehealth (INDEPENDENT_AMBULATORY_CARE_PROVIDER_SITE_OTHER): Payer: Medicare Other | Admitting: Internal Medicine

## 2021-02-15 ENCOUNTER — Encounter: Payer: Self-pay | Admitting: Internal Medicine

## 2021-02-15 DIAGNOSIS — U071 COVID-19: Secondary | ICD-10-CM | POA: Diagnosis not present

## 2021-02-15 MED ORDER — MOLNUPIRAVIR 200 MG PO CAPS: 800.0000 mg | ORAL_CAPSULE | Freq: Two times a day (BID) | ORAL | 0 refills | Status: DC

## 2021-02-15 NOTE — Assessment & Plan Note (Addendum)
Acute Today is day 4 of symptoms, tested positive yesterday Symptoms mild She is high risk for complications due to age No recent GFR so can not prescribe paxlovid Will prescribe molnupiravir 800 mg BID x 5 days.  Discussed possible side effects Advised otc medications for symptoms relief Rest, fluids Advised her to call with any questions or concerns

## 2021-02-24 DIAGNOSIS — M810 Age-related osteoporosis without current pathological fracture: Secondary | ICD-10-CM | POA: Diagnosis not present

## 2021-03-21 DIAGNOSIS — H2513 Age-related nuclear cataract, bilateral: Secondary | ICD-10-CM | POA: Diagnosis not present

## 2021-03-21 DIAGNOSIS — H524 Presbyopia: Secondary | ICD-10-CM | POA: Diagnosis not present

## 2021-03-21 DIAGNOSIS — H5213 Myopia, bilateral: Secondary | ICD-10-CM | POA: Diagnosis not present

## 2021-04-12 DIAGNOSIS — Z23 Encounter for immunization: Secondary | ICD-10-CM | POA: Diagnosis not present

## 2021-05-10 NOTE — Progress Notes (Signed)
Subjective:    Patient ID: Nancy Carr, female    DOB: [birth year], 75 y.o.   MRN: [id number]  HPI The patient is here for follow up of their chronic medical problems, including barrett's esophagus, osteopenia, rosacea, elevated cholesterol, prediabetes  She walks regularly.  She will be moving to a senior community in [place] in a couple of months.  She needs paperwork filled out for that.  Medications and allergies reviewed with patient and updated if appropriate.  Patient Active Problem List   Diagnosis Date Noted   COVID 02/15/2021   Prediabetes 05/05/2017   Elevated cholesterol 03/16/2017   Family history of premature coronary artery disease 08/12/2014   History of Barrett's esophagus 01/10/2012   Rosacea 03/02/2011   Osteoporosis 08/31/2009   COLONIC POLYPS, HX OF 08/31/2009    Current Outpatient Medications on File Prior to Visit  Medication Sig Dispense Refill   Calcium Carb-Cholecalciferol (CALCIUM-VITAMIN D) 600-400 MG-UNIT TABS Take 1 tablet by mouth 2 (two) times daily.     denosumab (PROLIA) 60 MG/ML SOSY injection Inject 60 mg into the skin every 6 (six) months.     Ibuprofen-Diphenhydramine Cit (ADVIL PM PO) Take 1 tablet by mouth at bedtime as needed (SLEEP).     omeprazole-sodium bicarbonate (ZEGERID) 40-1100 MG per capsule Take 1 capsule by mouth 2 (two) times daily. (Patient taking differently: Take 1 capsule by mouth daily.) 180 capsule 4   No current facility-administered medications on file prior to visit.    Past Medical History:  Diagnosis Date   GERD (gastroesophageal reflux disease)    mild   Hx of colonic polyps 2007   villous adenoma    Osteoporosis    Rosacea     Past Surgical History:  Procedure Laterality Date   COLONOSCOPY     COLONOSCOPY W/ POLYPECTOMY     HYSTEROSCOPY     LAPAROSCOPIC APPENDECTOMY N/A 09/27/2017   Procedure: APPENDECTOMY LAPAROSCOPIC;  Surgeon: Stark Klein, MD;  Location: [facility];  Service: General;   Laterality: N/A;   POLYPECTOMY  2007   done by surgeon, Dr Dalbert Batman   RECTAL POLYPECTOMY     WISDOM TOOTH EXTRACTION      Social History   Socioeconomic History   Marital status: Widowed    Spouse name: Not on file   Number of children: 2   Years of education: Not on file   Highest education level: Not on file  Occupational History   Occupation: Retired  Tobacco Use   Smoking status: Never   Smokeless tobacco: Never  Vaping Use   Vaping Use: Never used  Substance and Sexual Activity   Alcohol use: Yes    Alcohol/week: 7.0 standard drinks    Types: 7 Glasses of wine per week   Drug use: No   Sexual activity: Never  Other Topics Concern   Not on file  Social History Narrative   Not on file   Social Determinants of Health   Financial Resource Strain: Not on file  Food Insecurity: Not on file  Transportation Needs: Not on file  Physical Activity: Not on file  Stress: Not on file  Social Connections: Not on file    Family History  Problem Relation Age of Onset   Uterine cancer Sister        clear cell   Melanoma Sister    Hypertension Sister    Asthma Sister    Coronary artery disease Father        also Alsheimer's; Parkinson's  Coronary artery disease Paternal Grandfather        also CVA   Heart attack Maternal Grandfather        < 12   Stroke Paternal Grandmother        > 73; also Alsheimer's   Dementia Mother        also Osteoporosis   Irritable bowel syndrome Mother    Heart attack Maternal Grandmother        < 65;also Alsheimer's   Dementia Maternal Uncle    Colon cancer Neg Hx    Stomach cancer Neg Hx    Esophageal cancer Neg Hx    Diabetes Neg Hx    Breast cancer Neg Hx     Review of Systems  Constitutional:  Negative for fever.  Respiratory:  Negative for cough, shortness of breath and wheezing.   Cardiovascular:  Negative for chest pain, palpitations and leg swelling.  Gastrointestinal:  Negative for abdominal pain, blood in stool,  constipation, diarrhea and nausea.  Genitourinary:  Negative for dysuria.  Musculoskeletal:  Positive for back pain. Negative for arthralgias.  Skin:  Negative for rash.  Neurological:  Negative for dizziness, light-headedness and headaches.  Psychiatric/Behavioral:  Negative for dysphoric mood. The patient is not nervous/anxious.       Objective:   Vitals:   05/11/21 1427  BP: 126/80  Pulse: 85  Temp: 98 F (36.7 C)  SpO2: 95%   BP Readings from Last 3 Encounters:  05/11/21 126/80  01/15/20 136/72  06/11/18 118/80   Wt Readings from Last 3 Encounters:  05/11/21 153 lb (69.4 kg)  01/15/20 153 lb (69.4 kg)  06/11/18 150 lb 12.8 oz (68.4 kg)   Body mass index is 28.91 kg/m.   Physical Exam    Constitutional: Appears well-developed and well-nourished. No distress.  HENT:  Head: Normocephalic and atraumatic.  Neck: Neck supple. No tracheal deviation present. No thyromegaly present.  No cervical lymphadenopathy Cardiovascular: Normal rate, regular rhythm and normal heart sounds.   No murmur heard. No carotid bruit .  No edema Pulmonary/Chest: Effort normal and breath sounds normal. No respiratory distress. No has no wheezes. No rales. Abdomen: Soft, nontender, nondistended Skin: Skin is warm and dry. Not diaphoretic.  Psychiatric: Normal mood and affect. Behavior is normal.      Assessment & Plan:    I spent 20 minutes dedicated to the care of this patient on the date of this encounter including review of last labs, obtaining history, reviewing recommended immunizations, communicating with the patient, ordering tests, filling out paperwork for her to move into the senior living community in [place] and documenting clinical information in the EHR   See Problem List for Assessment and Plan of chronic medical problems.    This visit occurred during the SARS-CoV-2 public health emergency.  Safety protocols were in place, including screening questions prior to the visit,  additional usage of staff PPE, and extensive cleaning of exam room while observing appropriate contact time as indicated for disinfecting solutions.

## 2021-05-10 NOTE — Patient Instructions (Addendum)
  Blood work was ordered.     Medications changes include :   none   We will send your paperwork to [place] with the move!

## 2021-05-11 ENCOUNTER — Other Ambulatory Visit: Payer: Self-pay

## 2021-05-11 ENCOUNTER — Encounter: Payer: Self-pay | Admitting: Internal Medicine

## 2021-05-11 ENCOUNTER — Ambulatory Visit (INDEPENDENT_AMBULATORY_CARE_PROVIDER_SITE_OTHER): Payer: Medicare Other | Admitting: Internal Medicine

## 2021-05-11 VITALS — BP 126/80 | HR 85 | Temp 98.0°F | Ht 61.0 in | Wt 153.0 lb

## 2021-05-11 DIAGNOSIS — M8580 Other specified disorders of bone density and structure, unspecified site: Secondary | ICD-10-CM

## 2021-05-11 DIAGNOSIS — E7800 Pure hypercholesterolemia, unspecified: Secondary | ICD-10-CM | POA: Diagnosis not present

## 2021-05-11 DIAGNOSIS — Z8719 Personal history of other diseases of the digestive system: Secondary | ICD-10-CM

## 2021-05-11 DIAGNOSIS — R7303 Prediabetes: Secondary | ICD-10-CM

## 2021-05-11 DIAGNOSIS — M810 Age-related osteoporosis without current pathological fracture: Secondary | ICD-10-CM

## 2021-05-11 LAB — CBC WITH DIFFERENTIAL/PLATELET
Basophils Absolute: 0 10*3/uL (ref 0.0–0.1)
Basophils Relative: 0.2 % (ref 0.0–3.0)
Eosinophils Absolute: 0.1 10*3/uL (ref 0.0–0.7)
Eosinophils Relative: 2.3 % (ref 0.0–5.0)
HCT: 43.3 % (ref 36.0–46.0)
Hemoglobin: 14.4 g/dL (ref 12.0–15.0)
Lymphocytes Relative: 28.6 % (ref 12.0–46.0)
Lymphs Abs: 1.7 10*3/uL (ref 0.7–4.0)
MCHC: 33.3 g/dL (ref 30.0–36.0)
MCV: 94.2 fl (ref 78.0–100.0)
Monocytes Absolute: 0.5 10*3/uL (ref 0.1–1.0)
Monocytes Relative: 8.9 % (ref 3.0–12.0)
Neutro Abs: 3.6 10*3/uL (ref 1.4–7.7)
Neutrophils Relative %: 60 % (ref 43.0–77.0)
Platelets: 302 10*3/uL (ref 150.0–400.0)
RBC: 4.59 Mil/uL (ref 3.87–5.11)
RDW: 13.5 % (ref 11.5–15.5)
WBC: 6 10*3/uL (ref 4.0–10.5)

## 2021-05-11 LAB — COMPREHENSIVE METABOLIC PANEL
ALT: 19 U/L (ref 0–35)
AST: 19 U/L (ref 0–37)
Albumin: 4.2 g/dL (ref 3.5–5.2)
Alkaline Phosphatase: 49 U/L (ref 39–117)
BUN: 17 mg/dL (ref 6–23)
CO2: 27 mEq/L (ref 19–32)
Calcium: 9.1 mg/dL (ref 8.4–10.5)
Chloride: 104 mEq/L (ref 96–112)
Creatinine, Ser: 0.71 mg/dL (ref 0.40–1.20)
GFR: 82.7 mL/min (ref >60.00)
Glucose, Bld: 87 mg/dL (ref 70–99)
Potassium: 4 mEq/L (ref 3.5–5.1)
Sodium: 141 mEq/L (ref 135–145)
Total Bilirubin: 0.3 mg/dL (ref 0.2–1.2)
Total Protein: 7.5 g/dL (ref 6.0–8.3)

## 2021-05-11 LAB — LIPID PANEL
Cholesterol: 201 mg/dL — ABNORMAL HIGH (ref 0–200)
HDL: 60.4 mg/dL (ref >39.00)
LDL Cholesterol: 100 mg/dL — ABNORMAL HIGH (ref 0–99)
NonHDL: 140.28
Total CHOL/HDL Ratio: 3
Triglycerides: 200 mg/dL — ABNORMAL HIGH (ref 0.0–149.0)
VLDL: 40 mg/dL (ref 0.0–40.0)

## 2021-05-11 LAB — HEMOGLOBIN A1C: Hgb A1c MFr Bld: 6.3 % (ref 4.6–6.5)

## 2021-05-11 NOTE — Assessment & Plan Note (Signed)
Chronic Check A1c Low sugar/carbohydrate diet Continue regular walking

## 2021-05-11 NOTE — Assessment & Plan Note (Signed)
Chronic-history of Barrett's-no evidence seen on EGD from 2018 GERD well-controlled Continue Zegerid

## 2021-05-11 NOTE — Assessment & Plan Note (Signed)
Chronic Check lipid panel, CMP Continue regular walking and healthy diet

## 2021-05-11 NOTE — Assessment & Plan Note (Addendum)
Chronic prolia Q 6 months by gyn Walking daily Taking calcium and vitamin d daily CBC, CMP

## 2021-07-13 DIAGNOSIS — L821 Other seborrheic keratosis: Secondary | ICD-10-CM | POA: Diagnosis not present

## 2021-07-13 DIAGNOSIS — L400 Psoriasis vulgaris: Secondary | ICD-10-CM | POA: Diagnosis not present

## 2021-07-25 DIAGNOSIS — Z23 Encounter for immunization: Secondary | ICD-10-CM | POA: Diagnosis not present

## 2021-08-08 DIAGNOSIS — M810 Age-related osteoporosis without current pathological fracture: Secondary | ICD-10-CM | POA: Diagnosis not present

## 2021-08-12 ENCOUNTER — Telehealth: Payer: Self-pay | Admitting: Internal Medicine

## 2021-08-12 DIAGNOSIS — Z111 Encounter for screening for respiratory tuberculosis: Secondary | ICD-10-CM | POA: Diagnosis not present

## 2021-08-12 NOTE — Telephone Encounter (Signed)
Patient is moving into nursing facility on Wednesday 11/09 & is needing tb test completed  Scheduled at [facility] location for Monday 11/07.. they advised patient order needed to be placed by Dr. Quay Burow for a future nurse visit  We did receive approval from [facility] to schedule

## 2021-08-15 ENCOUNTER — Other Ambulatory Visit: Payer: Medicare Other

## 2021-08-15 ENCOUNTER — Other Ambulatory Visit: Payer: Self-pay | Admitting: Internal Medicine

## 2021-08-15 DIAGNOSIS — Z111 Encounter for screening for respiratory tuberculosis: Secondary | ICD-10-CM | POA: Diagnosis not present

## 2021-08-15 NOTE — Telephone Encounter (Signed)
Had appt today

## 2021-08-16 DIAGNOSIS — M810 Age-related osteoporosis without current pathological fracture: Secondary | ICD-10-CM | POA: Diagnosis not present

## 2021-09-03 DIAGNOSIS — J069 Acute upper respiratory infection, unspecified: Secondary | ICD-10-CM | POA: Diagnosis not present

## 2021-09-03 DIAGNOSIS — R519 Headache, unspecified: Secondary | ICD-10-CM | POA: Diagnosis not present

## 2021-09-03 DIAGNOSIS — J101 Influenza due to other identified influenza virus with other respiratory manifestations: Secondary | ICD-10-CM | POA: Diagnosis not present

## 2021-09-03 DIAGNOSIS — R051 Acute cough: Secondary | ICD-10-CM | POA: Diagnosis not present

## 2021-09-03 DIAGNOSIS — Z20822 Contact with and (suspected) exposure to covid-19: Secondary | ICD-10-CM | POA: Diagnosis not present

## 2021-09-03 DIAGNOSIS — R0981 Nasal congestion: Secondary | ICD-10-CM | POA: Diagnosis not present

## 2021-09-27 DIAGNOSIS — Z20822 Contact with and (suspected) exposure to covid-19: Secondary | ICD-10-CM | POA: Diagnosis not present

## 2021-10-25 ENCOUNTER — Other Ambulatory Visit: Payer: Self-pay | Admitting: Neurosurgery

## 2021-10-25 DIAGNOSIS — Z1231 Encounter for screening mammogram for malignant neoplasm of breast: Secondary | ICD-10-CM

## 2021-12-30 ENCOUNTER — Ambulatory Visit
Admission: RE | Admit: 2021-12-30 | Discharge: 2021-12-30 | Disposition: A | Discharge status: Home / Self Care | Payer: Medicare Other | Source: Ambulatory Visit | Attending: Neurosurgery | Admitting: Neurosurgery

## 2021-12-30 ENCOUNTER — Other Ambulatory Visit: Payer: Self-pay

## 2021-12-30 DIAGNOSIS — Z1231 Encounter for screening mammogram for malignant neoplasm of breast: Secondary | ICD-10-CM

## 2022-07-29 IMAGING — MG MM DIGITAL SCREENING BILAT W/ TOMO AND CAD
8 series · 9 of 24 positions shown · non-contrast
Comparison: Previous exam(s).

CLINICAL DATA: Screening.

EXAM:
DIGITAL SCREENING BILATERAL MAMMOGRAM WITH TOMOSYNTHESIS AND CAD
TECHNIQUE: Bilateral screening digital craniocaudal and mediolateral oblique
mammograms were obtained. Bilateral screening digital breast
tomosynthesis was performed. The images were evaluated with
computer-aided detection.

[R CC synth-2D]
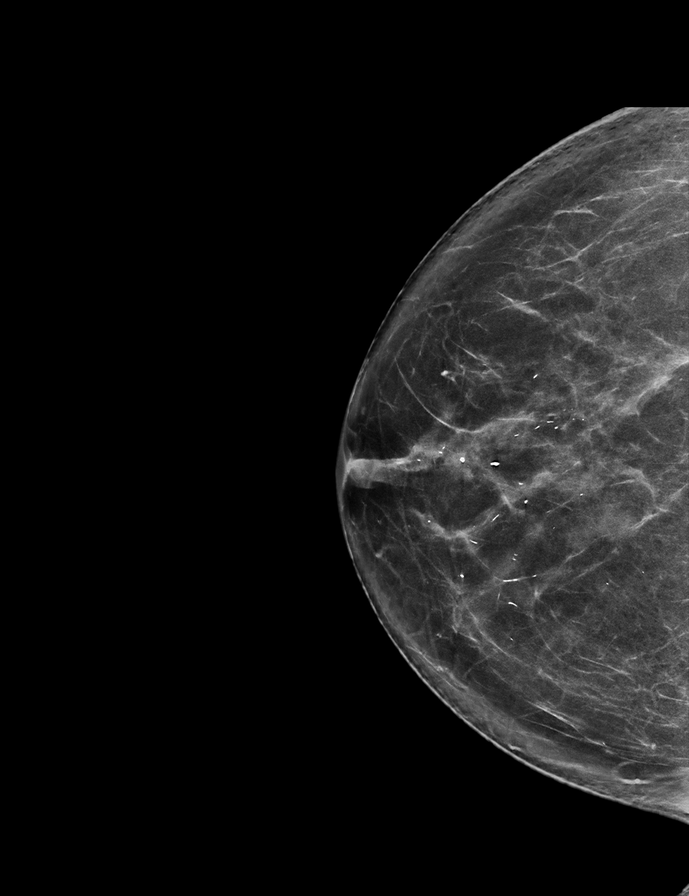

[R MLO synth-2D]
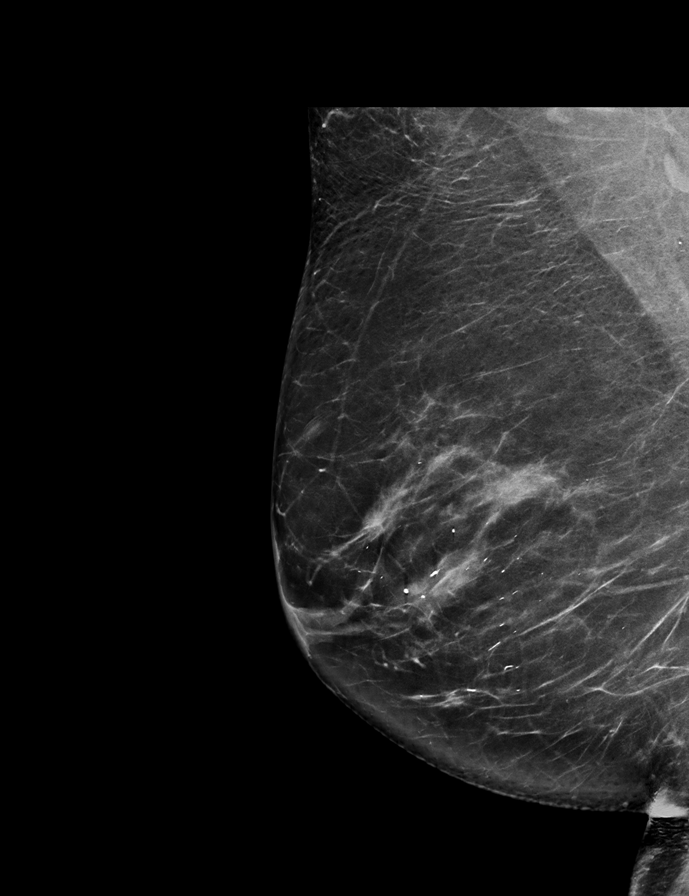

[L MLO synth-2D]
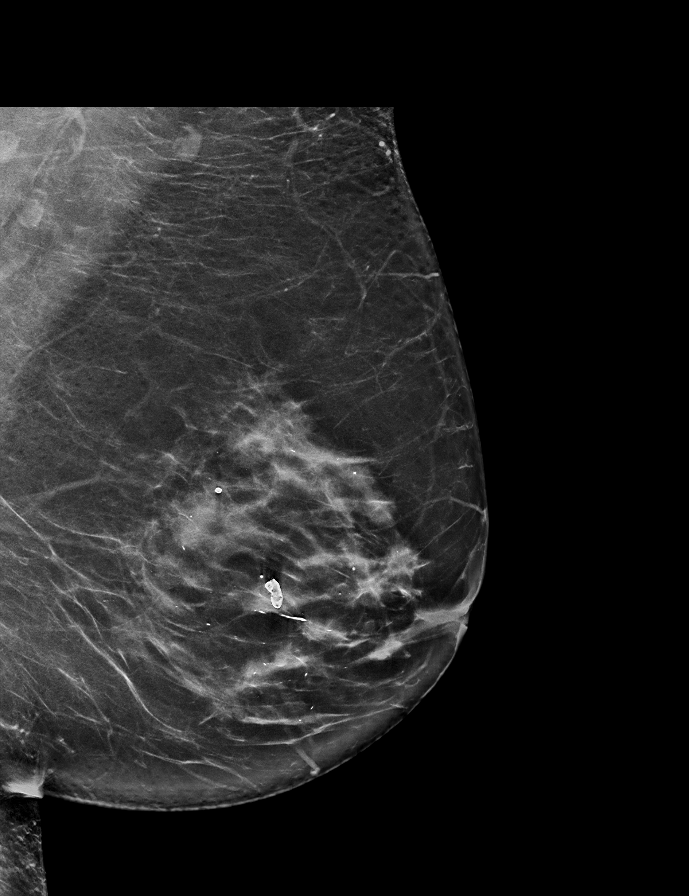

[L CC synth-2D]
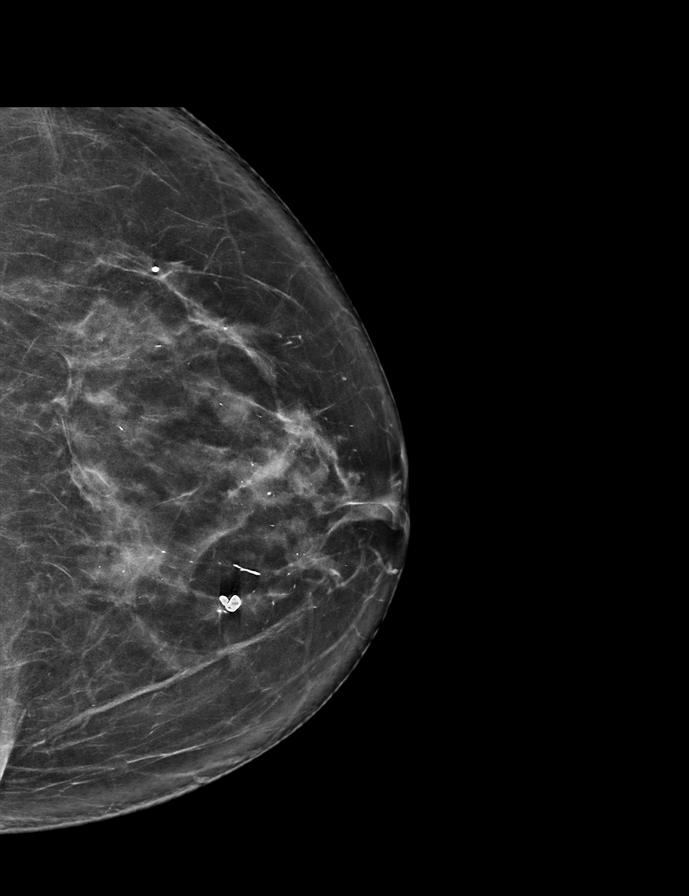

[L CC tomo · 2 of 74 frames shown]
[frame 24/74]
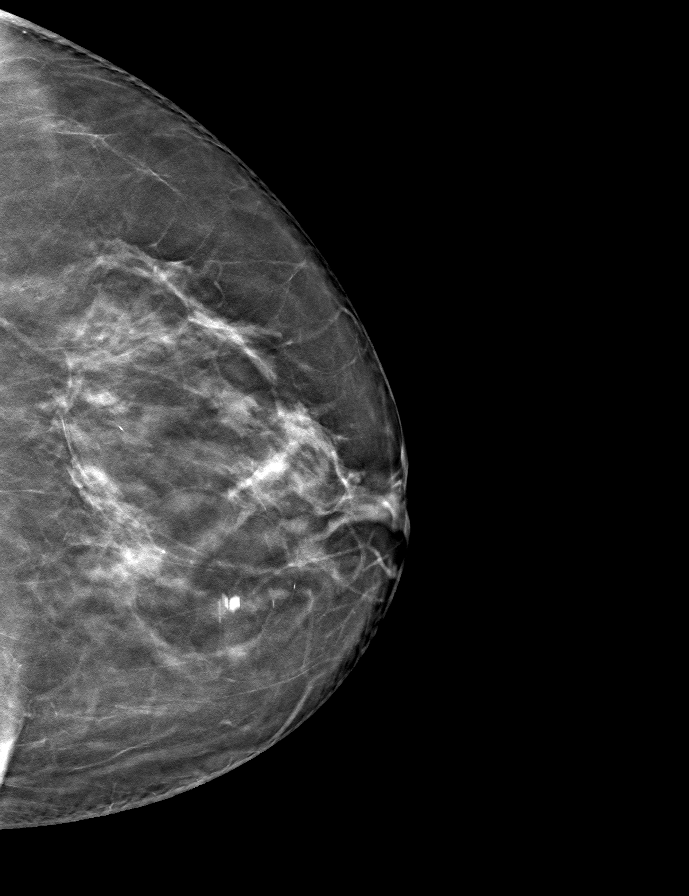
[frame 37/74]
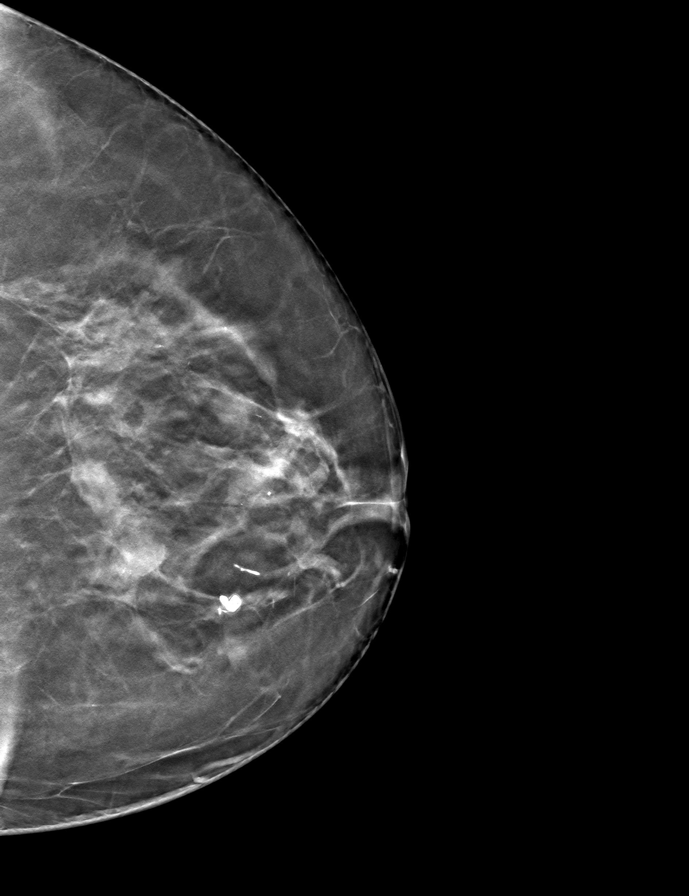

[R MLO tomo · tomo slice 39/78.0]
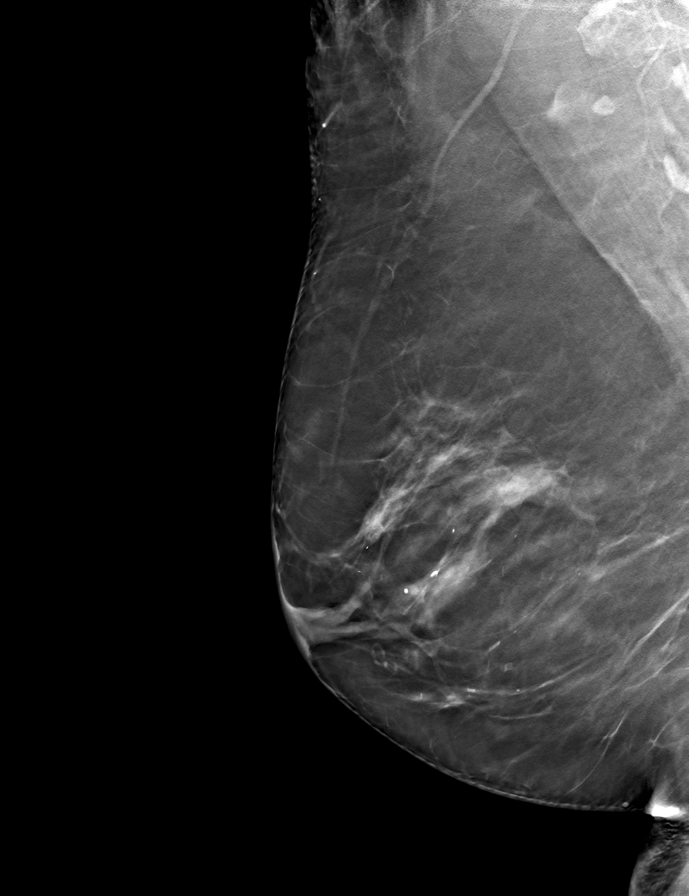

[L MLO tomo · tomo slice 41/81.0]
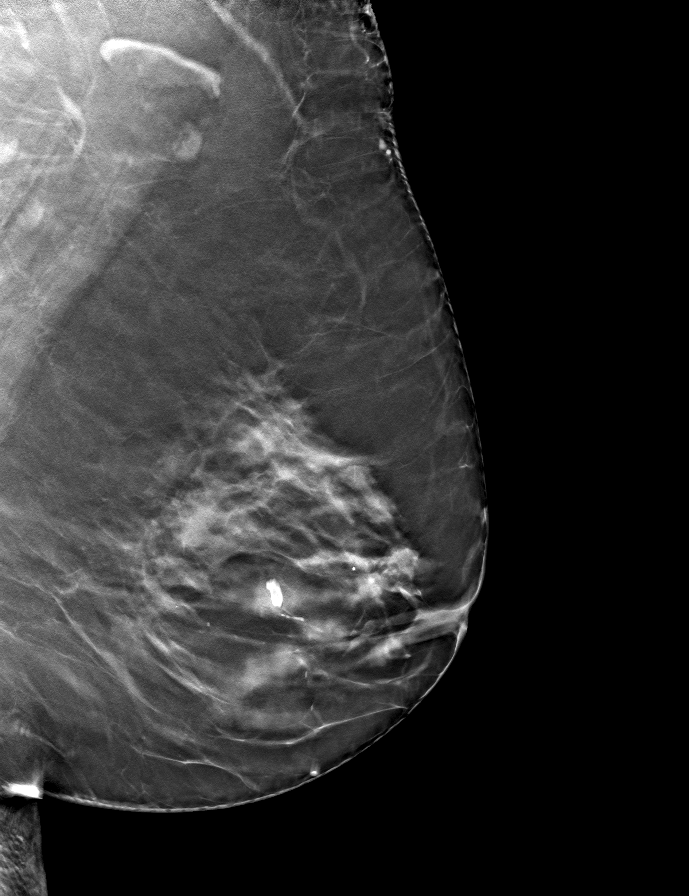

[R CC tomo · tomo slice 39/76.0]
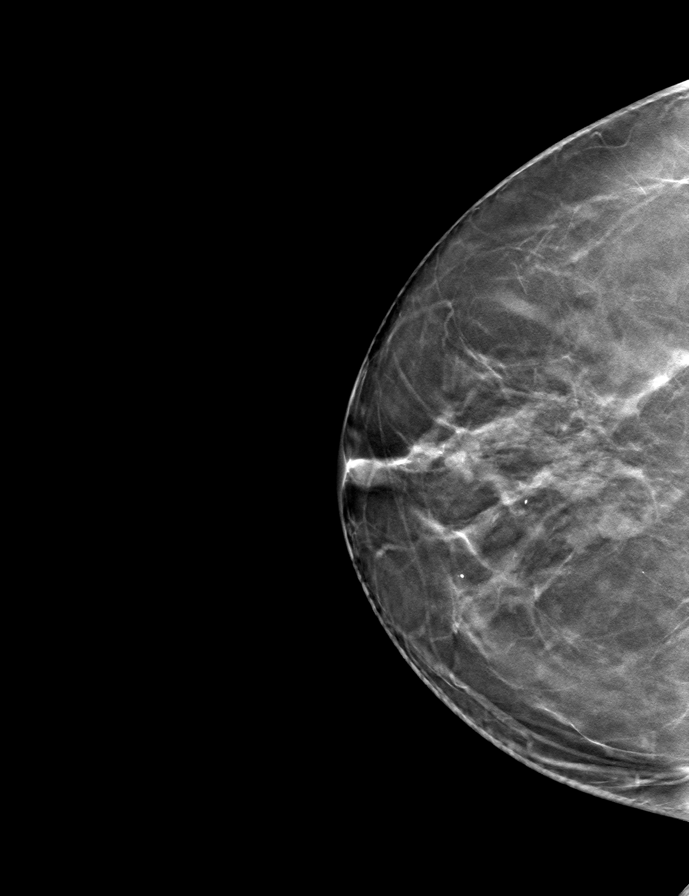

[9 of 24 positions shown; findings below may reference images not displayed]

ACR Breast Density Category c: The breast tissue is heterogeneously
dense, which may obscure small masses.
FINDINGS: There are no findings suspicious for malignancy.
IMPRESSION: No mammographic evidence of malignancy. A result letter of this
screening mammogram will be mailed directly to the patient.

RECOMMENDATION:
Screening mammogram in one year. (Code:Q3-W-BC3)

BI-RADS CATEGORY  1: Negative.
